# Patient Record
Sex: Female | Born: 1950 | Race: White | Hispanic: No | Marital: Married | Smoking: Never smoker
Health system: Southern US, Community
[De-identification: ages and names within clinical notes are randomized; demographics above are authoritative.]

## PROBLEM LIST (undated history)

## (undated) DIAGNOSIS — K219 Gastro-esophageal reflux disease without esophagitis: Secondary | ICD-10-CM

## (undated) DIAGNOSIS — J309 Allergic rhinitis, unspecified: Secondary | ICD-10-CM

## (undated) DIAGNOSIS — F419 Anxiety disorder, unspecified: Secondary | ICD-10-CM

## (undated) DIAGNOSIS — G4700 Insomnia, unspecified: Secondary | ICD-10-CM

## (undated) DIAGNOSIS — F329 Major depressive disorder, single episode, unspecified: Secondary | ICD-10-CM

## (undated) DIAGNOSIS — M8580 Other specified disorders of bone density and structure, unspecified site: Secondary | ICD-10-CM

## (undated) DIAGNOSIS — G35 Multiple sclerosis: Secondary | ICD-10-CM

## (undated) DIAGNOSIS — IMO0001 Reserved for inherently not codable concepts without codable children: Secondary | ICD-10-CM

## (undated) DIAGNOSIS — E785 Hyperlipidemia, unspecified: Secondary | ICD-10-CM

## (undated) DIAGNOSIS — A048 Other specified bacterial intestinal infections: Secondary | ICD-10-CM

## (undated) DIAGNOSIS — F988 Other specified behavioral and emotional disorders with onset usually occurring in childhood and adolescence: Secondary | ICD-10-CM

## (undated) DIAGNOSIS — F32A Depression, unspecified: Secondary | ICD-10-CM

## (undated) HISTORY — DX: Other specified bacterial intestinal infections: A04.8

## (undated) HISTORY — DX: Hyperlipidemia, unspecified: E78.5

## (undated) HISTORY — DX: Multiple sclerosis: G35

## (undated) HISTORY — PX: COLONOSCOPY: SHX174

## (undated) HISTORY — DX: Gastro-esophageal reflux disease without esophagitis: K21.9

## (undated) HISTORY — PX: ESOPHAGOGASTRODUODENOSCOPY: SHX1529

## (undated) HISTORY — PX: DILATION AND CURETTAGE OF UTERUS: SHX78

## (undated) HISTORY — DX: Reserved for inherently not codable concepts without codable children: IMO0001

## (undated) HISTORY — PX: OTHER SURGICAL HISTORY: SHX169

## (undated) HISTORY — DX: Other specified disorders of bone density and structure, unspecified site: M85.80

## (undated) HISTORY — DX: Insomnia, unspecified: G47.00

## (undated) HISTORY — DX: Major depressive disorder, single episode, unspecified: F32.9

## (undated) HISTORY — DX: Allergic rhinitis, unspecified: J30.9

## (undated) HISTORY — DX: Anxiety disorder, unspecified: F41.9

## (undated) HISTORY — DX: Other specified behavioral and emotional disorders with onset usually occurring in childhood and adolescence: F98.8

## (undated) HISTORY — DX: Depression, unspecified: F32.A

---

## 1998-11-24 ENCOUNTER — Ambulatory Visit (HOSPITAL_COMMUNITY): Admission: RE | Admit: 1998-11-24 | Discharge: 1998-11-24 | Payer: Self-pay | Admitting: *Deleted

## 1998-11-24 ENCOUNTER — Encounter: Payer: Self-pay | Admitting: *Deleted

## 2000-07-26 ENCOUNTER — Encounter: Payer: Self-pay | Admitting: Family Medicine

## 2000-07-26 ENCOUNTER — Ambulatory Visit (HOSPITAL_COMMUNITY): Admission: RE | Admit: 2000-07-26 | Discharge: 2000-07-26 | Payer: Self-pay | Admitting: Family Medicine

## 2000-08-29 ENCOUNTER — Other Ambulatory Visit: Admission: RE | Admit: 2000-08-29 | Discharge: 2000-08-29 | Payer: Self-pay | Admitting: *Deleted

## 2000-12-13 ENCOUNTER — Other Ambulatory Visit: Admission: RE | Admit: 2000-12-13 | Discharge: 2000-12-13 | Payer: Self-pay | Admitting: *Deleted

## 2002-04-04 HISTORY — PX: BREAST CYST ASPIRATION: SHX578

## 2002-06-11 ENCOUNTER — Encounter: Payer: Self-pay | Admitting: Obstetrics and Gynecology

## 2002-06-11 ENCOUNTER — Ambulatory Visit (HOSPITAL_COMMUNITY): Admission: RE | Admit: 2002-06-11 | Discharge: 2002-06-11 | Payer: Self-pay | Admitting: Obstetrics and Gynecology

## 2002-06-21 ENCOUNTER — Ambulatory Visit (HOSPITAL_COMMUNITY): Admission: RE | Admit: 2002-06-21 | Discharge: 2002-06-21 | Payer: Self-pay | Admitting: Internal Medicine

## 2002-06-24 ENCOUNTER — Other Ambulatory Visit: Admission: RE | Admit: 2002-06-24 | Discharge: 2002-06-24 | Payer: Self-pay | Admitting: Obstetrics and Gynecology

## 2003-08-11 ENCOUNTER — Ambulatory Visit (HOSPITAL_COMMUNITY): Admission: RE | Admit: 2003-08-11 | Discharge: 2003-08-11 | Payer: Self-pay | Admitting: Family Medicine

## 2003-12-23 ENCOUNTER — Ambulatory Visit (HOSPITAL_COMMUNITY): Admission: RE | Admit: 2003-12-23 | Discharge: 2003-12-23 | Payer: Self-pay | Admitting: Psychiatry

## 2004-09-01 ENCOUNTER — Ambulatory Visit (HOSPITAL_COMMUNITY): Admission: RE | Admit: 2004-09-01 | Discharge: 2004-09-01 | Payer: Self-pay | Admitting: Family Medicine

## 2005-03-21 ENCOUNTER — Other Ambulatory Visit: Payer: Self-pay

## 2005-03-23 ENCOUNTER — Ambulatory Visit (HOSPITAL_COMMUNITY): Admission: RE | Admit: 2005-03-23 | Discharge: 2005-03-23 | Payer: Self-pay | Admitting: Obstetrics and Gynecology

## 2005-04-08 ENCOUNTER — Ambulatory Visit: Payer: Self-pay | Admitting: Obstetrics and Gynecology

## 2005-11-09 ENCOUNTER — Ambulatory Visit (HOSPITAL_COMMUNITY): Admission: RE | Admit: 2005-11-09 | Discharge: 2005-11-09 | Payer: Self-pay | Admitting: Obstetrics and Gynecology

## 2006-03-17 ENCOUNTER — Ambulatory Visit: Payer: Self-pay | Admitting: Internal Medicine

## 2006-04-17 ENCOUNTER — Ambulatory Visit: Payer: Self-pay | Admitting: Internal Medicine

## 2006-06-02 ENCOUNTER — Ambulatory Visit (HOSPITAL_COMMUNITY): Admission: RE | Admit: 2006-06-02 | Discharge: 2006-06-02 | Payer: Self-pay | Admitting: Family Medicine

## 2006-11-16 ENCOUNTER — Ambulatory Visit (HOSPITAL_COMMUNITY): Admission: RE | Admit: 2006-11-16 | Discharge: 2006-11-16 | Payer: Self-pay | Admitting: Obstetrics and Gynecology

## 2006-12-21 ENCOUNTER — Ambulatory Visit (HOSPITAL_COMMUNITY): Admission: RE | Admit: 2006-12-21 | Discharge: 2006-12-21 | Payer: Self-pay | Admitting: Family Medicine

## 2008-02-11 ENCOUNTER — Ambulatory Visit: Payer: Self-pay | Admitting: Internal Medicine

## 2008-02-12 ENCOUNTER — Ambulatory Visit (HOSPITAL_COMMUNITY): Admission: RE | Admit: 2008-02-12 | Discharge: 2008-02-12 | Payer: Self-pay | Admitting: Family Medicine

## 2008-03-05 ENCOUNTER — Ambulatory Visit: Payer: Self-pay | Admitting: Internal Medicine

## 2008-03-05 ENCOUNTER — Ambulatory Visit (HOSPITAL_COMMUNITY): Admission: RE | Admit: 2008-03-05 | Discharge: 2008-03-05 | Payer: Self-pay | Admitting: Internal Medicine

## 2008-04-25 ENCOUNTER — Ambulatory Visit: Payer: Self-pay | Admitting: Internal Medicine

## 2008-05-23 ENCOUNTER — Ambulatory Visit (HOSPITAL_COMMUNITY): Admission: RE | Admit: 2008-05-23 | Discharge: 2008-05-23 | Payer: Self-pay | Admitting: Family Medicine

## 2008-11-25 ENCOUNTER — Encounter: Payer: Self-pay | Admitting: Urgent Care

## 2009-03-23 ENCOUNTER — Encounter: Payer: Self-pay | Admitting: Gastroenterology

## 2009-03-31 ENCOUNTER — Encounter: Payer: Self-pay | Admitting: Gastroenterology

## 2009-04-09 ENCOUNTER — Ambulatory Visit (HOSPITAL_COMMUNITY): Admission: RE | Admit: 2009-04-09 | Discharge: 2009-04-09 | Payer: Self-pay | Admitting: Family Medicine

## 2009-05-05 DIAGNOSIS — F411 Generalized anxiety disorder: Secondary | ICD-10-CM

## 2009-05-05 DIAGNOSIS — K228 Other specified diseases of esophagus: Secondary | ICD-10-CM

## 2009-05-05 DIAGNOSIS — F419 Anxiety disorder, unspecified: Secondary | ICD-10-CM | POA: Insufficient documentation

## 2009-05-05 DIAGNOSIS — K5909 Other constipation: Secondary | ICD-10-CM | POA: Insufficient documentation

## 2009-05-05 DIAGNOSIS — K219 Gastro-esophageal reflux disease without esophagitis: Secondary | ICD-10-CM

## 2009-05-05 DIAGNOSIS — Z8719 Personal history of other diseases of the digestive system: Secondary | ICD-10-CM | POA: Insufficient documentation

## 2009-05-05 DIAGNOSIS — K648 Other hemorrhoids: Secondary | ICD-10-CM | POA: Insufficient documentation

## 2009-05-05 DIAGNOSIS — G35 Multiple sclerosis: Secondary | ICD-10-CM | POA: Insufficient documentation

## 2009-05-05 DIAGNOSIS — F329 Major depressive disorder, single episode, unspecified: Secondary | ICD-10-CM

## 2009-05-05 DIAGNOSIS — F32A Depression, unspecified: Secondary | ICD-10-CM | POA: Insufficient documentation

## 2009-11-30 ENCOUNTER — Encounter: Payer: Self-pay | Admitting: Gastroenterology

## 2009-12-01 ENCOUNTER — Encounter: Payer: Self-pay | Admitting: Gastroenterology

## 2010-02-18 ENCOUNTER — Ambulatory Visit (HOSPITAL_COMMUNITY): Admission: RE | Admit: 2010-02-18 | Discharge: 2010-02-18 | Payer: Self-pay | Admitting: Family Medicine

## 2010-04-24 ENCOUNTER — Encounter: Payer: Self-pay | Admitting: Obstetrics and Gynecology

## 2010-04-25 ENCOUNTER — Encounter: Payer: Self-pay | Admitting: Obstetrics and Gynecology

## 2010-04-26 ENCOUNTER — Encounter: Payer: Self-pay | Admitting: Urgent Care

## 2010-04-30 ENCOUNTER — Encounter (INDEPENDENT_AMBULATORY_CARE_PROVIDER_SITE_OTHER): Payer: Self-pay | Admitting: *Deleted

## 2010-05-04 NOTE — Medication Information (Addendum)
Summary: ACIPHEX 20MG   ACIPHEX 20MG    Imported By: Rexene Alberts 12/01/2009 10:24:26  _____________________________________________________________________  External Attachment:    Type:   Image     Comment:   External Document  Appended Document: ACIPHEX 20MG  duplicate  Appended Document: ACIPHEX 20MG  Please schedule 1 yr FU OV   Prescriptions: ACIPHEX 20 MG TBEC (RABEPRAZOLE SODIUM) one by mouth daily for reflux  #30 x 0   Entered and Authorized by:   Joselyn Arrow FNP-BC   Signed by:   Joselyn Arrow FNP-BC on 04/26/2010   Method used:   Electronically to        [facility] (retail)       [address]       [place], [place]  [address]       Ph: [phone] or [phone]       Fax: [phone]   RxID:   [id number]     Appended Document: ACIPHEX 20MG  mailed letter for pt to call and set up OV

## 2010-05-04 NOTE — Medication Information (Signed)
Summary: ACIPHEX 20MG   ACIPHEX 20MG    Imported By: Rexene Alberts 11/30/2009 15:42:22  _____________________________________________________________________  External Attachment:    Type:   Image     Comment:   External Document  Appended Document: ACIPHEX 20MG     Prescriptions: ACIPHEX 20 MG TBEC (RABEPRAZOLE SODIUM) one by mouth daily for reflux  #30 x 5   Entered and Authorized by:   Leanna Battles. Dixon Boos   Signed by:   Leanna Battles Dixon Boos on 12/01/2009   Method used:   Electronically to        [facility] (retail)       [address]       [place], [place]  [address]       Ph: [phone] or [phone]       Fax: [phone]   RxID:   [id number]    NEEDS OV BY END OF YEAR FOR F/U GERD  Appended Document: ACIPHEX 20MG  LMOM FOR PT TO CALL TO SET UP OV TO FURTHER REFILLS

## 2010-05-06 NOTE — Medication Information (Signed)
Summary: ACIPHEX 20MG   ACIPHEX 20MG    Imported By: Rexene Alberts 04/26/2010 12:09:26  _____________________________________________________________________  External Attachment:    Type:   Image     Comment:   External Document  Appended Document: ACIPHEX 20MG  See today's append 12/01/09 note

## 2010-05-06 NOTE — Letter (Signed)
Summary: Recall Office Visit  [facility]  [address]   [place], [place] [address]   Phone: [phone]  Fax: [phone]      April 30, 2010   Jasmine Patton [address] [place], [place]  [address] 1950-08-06   Dear Ms. Kain,   According to our records, it is time for you to schedule a follow-up office visit with [place].   At your convenience, please call [phone] to schedule an office visit. If you have any questions, concerns, or feel that this letter is in error, we would appreciate your call.   Sincerely,    Diana Eves  [facility] Ph: [phone]   Fax: [phone]

## 2010-08-06 ENCOUNTER — Other Ambulatory Visit: Payer: Self-pay | Admitting: Family Medicine

## 2010-08-06 DIAGNOSIS — Z139 Encounter for screening, unspecified: Secondary | ICD-10-CM

## 2010-08-09 ENCOUNTER — Ambulatory Visit (HOSPITAL_COMMUNITY)
Admission: RE | Admit: 2010-08-09 | Discharge: 2010-08-09 | Disposition: A | Discharge status: Home / Self Care | Payer: Medicare Other | Source: Ambulatory Visit | Attending: Family Medicine | Admitting: Family Medicine

## 2010-08-09 DIAGNOSIS — Z139 Encounter for screening, unspecified: Secondary | ICD-10-CM

## 2010-08-09 DIAGNOSIS — Z1231 Encounter for screening mammogram for malignant neoplasm of breast: Secondary | ICD-10-CM | POA: Insufficient documentation

## 2010-08-17 NOTE — Op Note (Signed)
Jasmine Patton, Jasmine Patton              ACCOUNT NO.:  [id number]   MEDICAL RECORD NO.:  [id number]          PATIENT TYPE:  AMB   LOCATION:  DAY                           FACILITY:  [facility]   PHYSICIAN:  R. Roetta Sessions, M.D. DATE OF BIRTH:  1951-01-22   DATE OF PROCEDURE:  03/05/2008  DATE OF DISCHARGE:                               OPERATIVE REPORT   INDICATIONS FOR PROCEDURE:  A 60 year old lady with positive family  history of colon cancer first relative at young age, chronic  constipation, refractory gastroesophageal reflux disease symptoms  longstanding in spite of PPI therapy.  She never had her upper GI tract  evaluated.  EGD colonoscopy now being done.  Risks, benefits,  alternatives, and limitations have been reviewed and questions were  answered.  She is agreeable.  Please see the documentation in the  medical record.   PROCEDURE NOTE:  O2 saturation, blood pressure, pulse, and respirations  monitored throughout the entire procedure.  Conscious sedation, Versed 6  mg IV and Demerol 125 mg IV in divided doses.  Cetacaine spray for  topical pharyngeal anesthesia.   INSTRUMENT:  Pentax video chip system.   FINDINGS:  EGD:  Examination of the tubular esophagus revealed patulous  EG junction with multiple distal esophageal erosions.  The longest being  approximately 5 cm of the GE junction.  There is no Barrett esophagus or  other abnormality.  EG junction was easily traversed.  Stomach:  Gastric  cavity was emptied and insufflated well with air.  Through examination  of the gastric mucosa including retroflexion at the proximal stomach and  esophagogastric junction demonstrated only a small hiatal hernia.  Pylorus was patent and easily traversed.  Examination of the bulb,  second portion revealed no abnormalities.  Therapeutic/diagnostic  maneuvers performed:  None.  The patient tolerated the procedure well.  Colonoscopy:  Digital rectal exam revealed no abnormalities.  Endoscopic  findings:  The prep was good.  Colon:  Colonic mucosa was surveyed from  the rectosigmoid junction through the left, transverse, right colon,  through the appendiceal orifice, ileocecal valve, and cecum.  These  structures were well seen and photographed for the record.  From this  level, the scope was slowly and cautiously withdrawn.  All previously  mentioned mucosal surfaces were once again seen.  The colon was somewhat  elongated and tortuous; however, the mucosa appeared entirely normal.  The scope was pulled down to the rectum.  The rectal vault was small and  was unable to retroflex, but for the same reason, I was able to see the  rectal mucosa very well on fall.  She had minimal internal hemorrhoids  in the anal papilla only.  The patient tolerated both procedures well  and was reacted in Endoscopy.   IMPRESSION:  Esophagogastroduodenoscopy multiple distal esophageal  erosions, patulous esophagogastric junction.   Findings consistent with moderately severe erosive reflux esophagitis,  small hiatal hernia, otherwise normal stomach, D1 and D2.  Colonoscopy  findings, anal papilla and internal hemorrhoids, otherwise normal  rectum.  Somewhat tortuous elongated, but otherwise normal-appearing  colon.   RECOMMENDATIONS:  1.  Stop Prilosec, begin AcipHex 20 mg orally twice daily 30 minutes      before breakfast and supper for 1 month and back off to once daily      before breakfast thereafter.  Antireflux literature provided to Ms.      Patton.  2. She is somewhat constipated.  I have recommended she start on some      MiraLax 17 g orally at bedtime.  If she has not had a stool on any      given day, we will plan to see this nice lady back in the office in      6 weeks out and see how she is doing.  She have to return for a      high-risk screening colonoscopy in 5 years' time.      Jonathon Bellows, M.D.  Electronically Signed     RMR/MEDQ  D:  03/05/2008  T:  03/05/2008   Job:  [id number]   cc:   Jasmine Patton, M.D.  Fax: [phone]

## 2010-08-17 NOTE — H&P (Signed)
NAMECIEANNA, Jasmine Patton              ACCOUNT NO.:  [id number]   MEDICAL RECORD NO.:  [id number]          PATIENT TYPE:  AMB   LOCATION:  DAY                           FACILITY:  [facility]   PHYSICIAN:  R. Roetta Sessions, M.D. DATE OF BIRTH:  07/07/1950   DATE OF ADMISSION:  DATE OF DISCHARGE:  LH                              HISTORY & PHYSICAL   PRIMARY CARE PHYSICIAN:  Donna Bernard, MD.   CHIEF COMPLAINTS:  High-risk screening colonoscopy.   HISTORY OF PRESENT ILLNESS:  Jasmine Patton is a 60 year old Caucasian  female.  She has a family history of colon cancer diagnosed in her  mother in her 36s.  She is due for 5-year high-risk screening  colonoscopy.  She has had some chronic constipation, but generally has a  bowel movement every other day.  She rarely takes colon cleanser over  the counter.  She has some straining at times.  Denies any recent rectal  bleeding or melena.  Her weight is up approximately 20 pounds from her  baseline per her report.  She notes her appetite has been very good, but  she has noticed worsening of her heartburn, especially with certain  foods and wine.  She has been trying to avoid foods and is currently  trying to lose some weight.  She occasionally has to take Tums in  addition to her omeprazole 20 mg b.i.d.  She has about 6-7 episodes per  month with breakthrough symptoms.  She denies any dysphagia or  odynophagia.  She has never had a screening EGD for Barrett esophagus.   PAST MEDICAL AND SURGICAL HISTORY:  Chronic GERD, MS, anxiety,  depression, uterine ablation, left benign breast cyst removal, neurosis,  and history of H. Pylori, positive serology treated by Dr. Gerda Diss  several years ago.  Last colonoscopy in June 21, 2002, by Dr. Jena Gauss  anal papilla, otherwise normal.   CURRENT MEDICATIONS:  1. Xanax 0.5 mg p.r.n.  2. Doxepin hydrochloride 50-100 mg nightly.  3. Pristiq b.i.d.  4. Omeprazole 20 mg b.i.d.  5. Phentermine HCl 37.5 mg daily.  6. Cerefolin 5.6/2 600 mg daily.  7. Multivitamin daily.  8. Calcium 500 mg.  9. Vitamin D daily.   ALLERGIES:  No known drug allergies.   FAMILY HISTORY:  Positive for mother in her 65s diagnosed with  colorectal carcinoma.  She also had history of breast cancer and  dementia, died at age 4.  Father deceased at 71 secondary to coronary  artery disease.  She has 2 healthy siblings.   SOCIAL HISTORY:  Jasmine Patton is married.  She is disabled.  She denies  any tobacco or drug use.  She consumes about 5 glasses of wine per week.   REVIEW OF SYSTEMS:  Endocrine:  Given weight gain, she is having a TSH  checked.  She also had a Pap smear last week at Dr. Fletcher Anon office.  Otherwise, negative review of systems.   PHYSICAL EXAMINATION:  VITAL SIGNS:  Weight 174.5 pounds, height 60  inches, temperature 97.9, blood pressure 130/76, and pulse 88.  GENERAL:  She is an  obese Caucasian female who is alert, oriented,  pleasant, and cooperative, in no acute distress.  HEENT:  Sclerae clear and nonicteric.  Conjunctivae pink.  Oropharynx  pink and moist without any lesions.  CHEST:  Heart regular rate and rhythm.  Normal S1 and S2 without any  murmurs, clicks, rubs, or gallops.  LUNGS:  Clear to auscultation bilaterally.  ABDOMEN:  Positive bowel sounds x4.  No bruits auscultated.  Soft,  nontender, and nondistended without palpable mass or hepatosplenomegaly.  No rebound, tenderness, or guarding.  EXTREMITIES:  Without clubbing or edema.  SKIN:  Pink, warm, and dry without any rash or jaundice.   IMPRESSION:  Jasmine Patton is a 60 year old Caucasian female who is due  for high-risk screening colonoscopy.  She does have some chronic  constipation, but does not interfere with her activities of daily  living.  She rarely takes over-the-counter supplements for this.   She has chronic gastroesophageal reflux disease with occasional  breakthrough symptoms and has never had screening for Barrett  esophagus.   PLAN:  1. High-risk screening colonoscopy and screening EGD.  At the same      time, discussed procedure including risks and benefits including,      but not limited to bleeding, infection, perforation, or drug      reaction.  A planned consent will be obtained.  2. Continue omeprazole 20 mg b.i.d.      [name], N.P.      Jonathon Bellows, M.D.  Electronically Signed    KJ/MEDQ  D:  02/11/2008  T:  02/12/2008  Job:  [id number]   cc:   Donna Bernard, M.D.  Fax: [phone]   Alanson Puls, M.D.

## 2010-08-17 NOTE — Assessment & Plan Note (Signed)
Jasmine Patton, Jasmine Patton               CHART#:  [id number]   DATE:  04/25/2008                       DOB:  December 27, 1950   CHIEF COMPLAINT:  Follow up GERD, erosive reflux esophagitis, and  constipation.   PROBLEM LIST:  1. Family history of colon cancer.  2. Chronic constipation.  EGD by Dr. Jena Gauss on March 05, 2008,      multiple distal esophageal erosions, patulous EG junction.  3. GERD and erosive reflux esophagitis with last colonoscopy March 05, 2008, by Dr. Jena Gauss, anal papilla, internal hemorrhoids.  4. MS  5. Anxiety and depression.  6. Status post uterine ablation.  7. History of H. pylori, positive serologies by Dr. Gerda Diss several      years ago.   SUBJECTIVE:  The patient is a 60 year old Caucasian female.  She was  found to have erosive reflux esophagitis.  She has been on Aciphex 20 mg  b.i.d. and doing very well.  She has just recently started once daily  Aciphex.  She denies any breakthrough symptoms at this time, but again  she has just started.  She continues to have chronic constipation, but  she has not been taking her fiber and she has not followed with  recommendation of MiraLax yet either.  She can go several days without a  bowel movement.  She feels as though this has been a chronic problem  because of her MS.  Her appetite is good.  She is wanting to lose some  weight this year.  Her weight is down 5 pounds and this has been  intentional.   CURRENT MEDICATIONS:  See the list from April 25, 2008.   ALLERGIES:  No known drug allergies.   OBJECTIVE:  VITAL SIGNS:  Weight 169 pounds, height 60 inches,  temperature 97.6, blood pressure 110/78, and pulse 68.  GENERAL:  She is a well-developed and well-nourished Caucasian female in  no acute distress.  HEENT:  Sclerae clear, nonicteric.  Conjunctivae pink.  Oropharynx pink  and moist without any lesions.  CHEST:  Heart, regular rate and rhythm.  Normal S1 and S2.  ABDOMEN:  Positive bowel sounds x4.   No bruits auscultated.  Soft,  nontender, nondistended without palpable mass or hepatosplenomegaly.  No  rebound, tenderness, or guarding.  EXTREMITIES:  Without clubbing or edema.   ASSESSMENT:  Gastroesophageal reflux disease/erosive reflux esophagitis  well controlled on proton pump inhibitor.  She has just started daily  Aciphex.  She will call if she has any breakthrough symptoms.   PLAN:  1. MiraLax 17 g daily as needed for constipation.  2. Add Benefiber to her daily routine.  3. She will increase her dietary fiber.  4. Suggested aqua aerobics classes and 6 small meals per day for GERD      and diet control.  5. Office visit with Dr. Jena Gauss in 1 year.       [name], N.P.  Electronically Signed     R. Roetta Sessions, M.D.  Electronically Signed    KJ/MEDQ  D:  04/25/2008  T:  04/26/2008  Job:  [id number]   cc:   Donna Bernard, M.D.

## 2010-08-20 NOTE — Op Note (Signed)
   NAME:  Jasmine Patton, Jasmine Patton                        ACCOUNT NO.:  [id number]   MEDICAL RECORD NO.:  [id number]                   PATIENT TYPE:  AMB   LOCATION:  DAY                                  FACILITY:  [facility]   PHYSICIAN:  R. Roetta Sessions, M.D.              DATE OF BIRTH:  06-15-1950   DATE OF PROCEDURE:  06/21/2002  DATE OF DISCHARGE:                                 OPERATIVE REPORT   PROCEDURE:  High-risk screening colonoscopy.   INDICATIONS FOR PROCEDURE:  The patient is a 60 year old lady devoid of any  lower GI tract symptoms whose mother was diagnosed with colorectal cancer in  her late 76s.  She has never had her lower GI tract evaluated.  She is now  coming in for high-risk screening colonoscopy.  This approach has been  discussed with the patient previously.  The potential risks, benefits, and  alternatives have been reviewed and questions answered.  She is agreeable.  Please see my handwritten H&P for more information.   PROCEDURE:  O2 saturation, blood pressure, pulses, and respirations were  monitored throughout the entire procedure.  Conscious sedation was with  Versed 5 mg IV, Demerol 125 mg IV in divided doses.  The instrument used was  the Olympus video chip adult colonoscope.   FINDINGS:  Digital rectal exam revealed a couple of pea-size lesions just  outside the anal verge.   ENDOSCOPIC FINDINGS:  The prep was excellent.   Rectum:  Examination of the rectal mucosa including a retroflex view of the  anal verge revealed four anal papillae, otherwise normal rectum.   Colon:  The colonic mucosa was surveyed from the rectosigmoid junction  through the left, transverse, right colon to the area of the appendiceal  orifice, ileocecal valve, and cecum.  These structures were well-seen and  photographed for the record.  The colonic mucosa appeared entirely normal  all the way to the cecum from the level of the cecum to the ileocecal valve.  The scope was slowly  withdrawn.  All previously mentioned mucosal surfaces  were again seen, and again, no other abnormalities were observed.  The  patient tolerated the procedure well and was reactive in endoscopy.    IMPRESSION:  Anal papillae.  Otherwise normal rectum, normal colon.   RECOMMENDATIONS:  Repeat colonoscopy in five years.                                               Jonathon Bellows, M.D.    RMR/MEDQ  D:  06/21/2002  T:  06/21/2002  Job:  [id number]   cc:   Lorin Picket A. Gerda Diss, M.D.  [address]  [place]  [place] [address]  Fax: [phone]

## 2011-10-25 ENCOUNTER — Other Ambulatory Visit: Payer: Self-pay | Admitting: Family Medicine

## 2011-10-25 DIAGNOSIS — M8580 Other specified disorders of bone density and structure, unspecified site: Secondary | ICD-10-CM

## 2011-11-21 ENCOUNTER — Other Ambulatory Visit (HOSPITAL_COMMUNITY): Payer: Medicare Other

## 2012-09-14 ENCOUNTER — Telehealth: Payer: Self-pay | Admitting: Family Medicine

## 2012-09-14 ENCOUNTER — Other Ambulatory Visit (HOSPITAL_COMMUNITY): Payer: Self-pay | Admitting: Family Medicine

## 2012-09-14 MED ORDER — METAXALONE 800 MG PO TABS: 800.0000 mg | ORAL_TABLET | Freq: Three times a day (TID) | ORAL | Status: DC

## 2012-09-14 NOTE — Telephone Encounter (Signed)
Pulled her back muscles working the yard.  Would like to know if a prescription for Skelaxin?  [facility].  States she is unable to drive to come in for an office visit.  Thanks

## 2012-09-14 NOTE — Telephone Encounter (Signed)
Med sent electronically to [facility]. Patient notified. 

## 2012-09-14 NOTE — Telephone Encounter (Signed)
skel 800 #30 one tid prn one ref

## 2012-09-17 ENCOUNTER — Encounter: Payer: Self-pay | Admitting: *Deleted

## 2012-10-18 ENCOUNTER — Other Ambulatory Visit (HOSPITAL_COMMUNITY): Payer: Self-pay | Admitting: Family Medicine

## 2012-10-27 ENCOUNTER — Other Ambulatory Visit: Payer: Self-pay

## 2012-10-27 MED ORDER — ALPRAZOLAM 0.5 MG PO TABS: 0.5000 mg | ORAL_TABLET | Freq: Two times a day (BID) | ORAL | Status: DC

## 2012-11-16 ENCOUNTER — Other Ambulatory Visit (HOSPITAL_COMMUNITY): Payer: Self-pay | Admitting: Family Medicine

## 2012-12-10 ENCOUNTER — Ambulatory Visit: Payer: Self-pay | Admitting: Family Medicine

## 2012-12-13 ENCOUNTER — Ambulatory Visit (INDEPENDENT_AMBULATORY_CARE_PROVIDER_SITE_OTHER): Payer: 59 | Admitting: Family Medicine

## 2012-12-13 ENCOUNTER — Encounter: Payer: Self-pay | Admitting: Family Medicine

## 2012-12-13 VITALS — BP 148/94 | Ht 61.0 in | Wt 172.8 lb

## 2012-12-13 DIAGNOSIS — F411 Generalized anxiety disorder: Secondary | ICD-10-CM

## 2012-12-13 DIAGNOSIS — K219 Gastro-esophageal reflux disease without esophagitis: Secondary | ICD-10-CM

## 2012-12-13 DIAGNOSIS — G35 Multiple sclerosis: Secondary | ICD-10-CM

## 2012-12-13 DIAGNOSIS — F329 Major depressive disorder, single episode, unspecified: Secondary | ICD-10-CM

## 2012-12-13 MED ORDER — FLUOXETINE HCL 20 MG PO CAPS: 20.0000 mg | ORAL_CAPSULE | Freq: Every day | ORAL | Status: DC

## 2012-12-13 MED ORDER — OMEPRAZOLE 20 MG PO CPDR: DELAYED_RELEASE_CAPSULE | ORAL | Status: DC

## 2012-12-13 MED ORDER — DOXEPIN HCL 50 MG PO CAPS: ORAL_CAPSULE | ORAL | Status: DC

## 2012-12-13 NOTE — Progress Notes (Signed)
  Subjective:    Patient ID: Jasmine Patton, female    DOB: December 20, 1950, 62 y.o.   MRN: [id number]  HPI Here today to discuss medications.  The Adderall is keeping her up all night. Causes trouble sleeping. Patient states she is getting help from the Adderall but the insomnia is a problem.  Has not seen her multiple sclerosis specialist lately  Overall the reflux is stable. Trying to diminish her spicy foods.    The Celexa is not working well; energy level is still low and she is not interested in much. Under quite a bit of stress. Son is getting married soon. Prozac has done well in the past. She wonders if she should take this.      Review of Systems    no chest pain no back pain no abdominal pain no change in bowel habits. No rash ROS otherwise negative. Objective:   Physical Exam Alert no acute distress. Lungs clear. Heart regular in rhythm. HEENT normal. Neuro exam intact.       Assessment & Plan:  Impression 1 ADHD suboptimum secondary to side effects. #2 depression somewhat worse discuss. #3 multiple sclerosis. #4 insomnia. #5 chronic anxiety states she definitely needs the Xanax that she takes. Plan change Adderall to 10 mg plain one each morning. Diet exercise discussed in encourage. Stop Celexa and start Prozac 20 every morning rationale discussed recheck in 4 months. WSL

## 2013-03-05 ENCOUNTER — Encounter: Payer: Self-pay | Admitting: Internal Medicine

## 2013-03-05 ENCOUNTER — Other Ambulatory Visit: Payer: Self-pay | Admitting: Family Medicine

## 2013-03-22 ENCOUNTER — Telehealth: Payer: Self-pay | Admitting: Family Medicine

## 2013-03-22 MED ORDER — FLUOXETINE HCL 20 MG PO TABS: 30.0000 mg | ORAL_TABLET | Freq: Every day | ORAL | Status: DC

## 2013-03-22 NOTE — Telephone Encounter (Signed)
Pt currently on Prozac 20mg , feels like it is helping but not enough and she feels like it needs to be increased, has taken before at a higher dose.  Has mistakenly taken twice in a day and felt better. Can we call in a stronger dose?  Please call in to [facility]

## 2013-03-22 NOTE — Telephone Encounter (Signed)
Medication sent in to pharmacy. Patient was notified.  

## 2013-03-22 NOTE — Telephone Encounter (Signed)
30 mg 6 ref

## 2013-04-15 ENCOUNTER — Ambulatory Visit (INDEPENDENT_AMBULATORY_CARE_PROVIDER_SITE_OTHER): Payer: 59 | Admitting: Family Medicine

## 2013-04-15 ENCOUNTER — Encounter: Payer: Self-pay | Admitting: Family Medicine

## 2013-04-15 VITALS — BP 130/82 | Ht 61.0 in | Wt 168.0 lb

## 2013-04-15 DIAGNOSIS — F411 Generalized anxiety disorder: Secondary | ICD-10-CM

## 2013-04-15 DIAGNOSIS — G35D Multiple sclerosis, unspecified: Secondary | ICD-10-CM

## 2013-04-15 DIAGNOSIS — G35 Multiple sclerosis: Secondary | ICD-10-CM

## 2013-04-15 DIAGNOSIS — K219 Gastro-esophageal reflux disease without esophagitis: Secondary | ICD-10-CM

## 2013-04-15 DIAGNOSIS — F329 Major depressive disorder, single episode, unspecified: Secondary | ICD-10-CM

## 2013-04-15 DIAGNOSIS — F3289 Other specified depressive episodes: Secondary | ICD-10-CM

## 2013-04-15 MED ORDER — ALPRAZOLAM 0.5 MG PO TABS: 0.5000 mg | ORAL_TABLET | Freq: Two times a day (BID) | ORAL | Status: DC

## 2013-04-15 MED ORDER — AMPHETAMINE-DEXTROAMPHETAMINE 10 MG PO TABS: 10.0000 mg | ORAL_TABLET | ORAL | Status: DC | PRN

## 2013-04-15 MED ORDER — METAXALONE 800 MG PO TABS: 800.0000 mg | ORAL_TABLET | Freq: Three times a day (TID) | ORAL | Status: DC

## 2013-04-15 MED ORDER — DESVENLAFAXINE SUCCINATE ER 50 MG PO TB24: 50.0000 mg | ORAL_TABLET | Freq: Every day | ORAL | Status: DC

## 2013-04-15 NOTE — Patient Instructions (Signed)
Cut prozac dose in half for one day, then the next day stop prozac and start the pritiq. If after several wks not feeling significantly better, call us and we will increase the pristiq to 100 mg daily

## 2013-04-15 NOTE — Progress Notes (Signed)
   Subjective:    Patient ID: Jasmine Patton, female    DOB: [birth year], 63 y.o.   MRN: [id number]  HPI Patient arrives for a follow up on depression. Patient increased Prozac to 40 mg a month ago but has seen no improvement in symptoms. Patient states she is dealing with significant depression currently.  Feels balance not as good, not exercising as regularly. Not seen ms doc for some time   reflux stable, need the med. Worse with certain tyoes of wine  [place] still at home, constantly stresses mo, moved in temporarily three and a half yrs ago, now home permanently???  Sleep at night so so when taking the xanax, takes over an hour to fall asleep  ADHD generally takes a fraction of a tablet each day,  zoloft didn't help after a few yrs, took serzone for awhile,pristiq helped a lot, did well for awhile,  Review of Systems    no chest pain no back pain no headache no change about habits no blood in stool no weight loss weight gain ROS otherwise negative Objective:   Physical Exam Alert no acute distress. HEENT normal. Lungs clear. Heart regular in rhythm. Abdomen benign. Low back some pain and tenderness in his to percussion. Ankles trace edema. Strength appears intact.       Assessment & Plan:  Impression 1 ADHD good control discussed. #2 insomnia good control discussed. #3 multiple sclerosis has not seen specialists for several years. Strongly encouraged to get back with specialist. #4 depression suboptimum discuss plan change to proceed. Wean off current serotonin daughter. Diet exercise discussed. Medicines refilled. Recheck as scheduled. WSL

## 2013-05-16 ENCOUNTER — Other Ambulatory Visit: Payer: Self-pay | Admitting: Family Medicine

## 2013-06-15 ENCOUNTER — Other Ambulatory Visit: Payer: Self-pay | Admitting: Family Medicine

## 2013-09-27 ENCOUNTER — Other Ambulatory Visit: Payer: Self-pay | Admitting: Family Medicine

## 2013-09-27 NOTE — Telephone Encounter (Signed)
Ok one mo each rec ov

## 2013-10-09 ENCOUNTER — Other Ambulatory Visit: Payer: Self-pay | Admitting: Family Medicine

## 2013-11-06 ENCOUNTER — Other Ambulatory Visit: Payer: Self-pay | Admitting: Family Medicine

## 2013-11-06 NOTE — Telephone Encounter (Signed)
Last seen 04/15/13

## 2013-11-06 NOTE — Telephone Encounter (Signed)
Ok one mo ov soon

## 2013-12-08 ENCOUNTER — Other Ambulatory Visit: Payer: Self-pay | Admitting: Family Medicine

## 2013-12-10 NOTE — Telephone Encounter (Signed)
f u appt due also

## 2013-12-10 NOTE — Telephone Encounter (Signed)
Ok plus one mo ref

## 2014-01-06 ENCOUNTER — Other Ambulatory Visit: Payer: Self-pay | Admitting: Family Medicine

## 2014-01-06 NOTE — Telephone Encounter (Signed)
Ok one mo only needs ov

## 2014-01-08 NOTE — Telephone Encounter (Signed)
6 mo ok 

## 2014-02-07 ENCOUNTER — Other Ambulatory Visit: Payer: Self-pay | Admitting: Nurse Practitioner

## 2014-02-07 ENCOUNTER — Encounter: Payer: Self-pay | Admitting: Family Medicine

## 2014-02-07 ENCOUNTER — Ambulatory Visit (INDEPENDENT_AMBULATORY_CARE_PROVIDER_SITE_OTHER): Payer: 59 | Admitting: Family Medicine

## 2014-02-07 VITALS — BP 130/70 | Ht 61.0 in | Wt 169.1 lb

## 2014-02-07 DIAGNOSIS — E785 Hyperlipidemia, unspecified: Secondary | ICD-10-CM

## 2014-02-07 DIAGNOSIS — F329 Major depressive disorder, single episode, unspecified: Secondary | ICD-10-CM

## 2014-02-07 DIAGNOSIS — Z23 Encounter for immunization: Secondary | ICD-10-CM

## 2014-02-07 DIAGNOSIS — E039 Hypothyroidism, unspecified: Secondary | ICD-10-CM

## 2014-02-07 DIAGNOSIS — K219 Gastro-esophageal reflux disease without esophagitis: Secondary | ICD-10-CM

## 2014-02-07 DIAGNOSIS — G35 Multiple sclerosis: Secondary | ICD-10-CM

## 2014-02-07 DIAGNOSIS — R5382 Chronic fatigue, unspecified: Secondary | ICD-10-CM

## 2014-02-07 DIAGNOSIS — G4700 Insomnia, unspecified: Secondary | ICD-10-CM

## 2014-02-07 DIAGNOSIS — Z79899 Other long term (current) drug therapy: Secondary | ICD-10-CM

## 2014-02-07 DIAGNOSIS — I10 Essential (primary) hypertension: Secondary | ICD-10-CM

## 2014-02-07 DIAGNOSIS — F32A Depression, unspecified: Secondary | ICD-10-CM

## 2014-02-07 DIAGNOSIS — R5383 Other fatigue: Secondary | ICD-10-CM

## 2014-02-07 MED ORDER — DESVENLAFAXINE SUCCINATE ER 100 MG PO TB24: 100.0000 mg | ORAL_TABLET | Freq: Every day | ORAL | Status: DC

## 2014-02-07 MED ORDER — METAXALONE 800 MG PO TABS: 800.0000 mg | ORAL_TABLET | Freq: Three times a day (TID) | ORAL | Status: AC

## 2014-02-07 MED ORDER — SUVOREXANT 10 MG PO TABS: 10.0000 mg | ORAL_TABLET | Freq: Every day | ORAL | Status: DC

## 2014-02-07 MED ORDER — ALPRAZOLAM 0.5 MG PO TABS: ORAL_TABLET | ORAL | Status: DC

## 2014-02-07 NOTE — Progress Notes (Signed)
   Subjective:    Patient ID: Jasmine Patton, female    DOB: May 21, 1950, 63 y.o.   MRN: [id number]  HPI Patient is here today for a follow up visit on depression/anxiety. Patient is doing well. Patient states that she is still depressed and fatigued a lot of times. She wants to discuss her current medications.   Patient needs a refill on her Progesterone cream to [facility]. Reports this helps with vaginal dryness.  Ongoing challenges with MS. Followed by specialist for this.  Continue challenges with reflux. Generally medication helped symptoms considerably.  Patient puzzled by her fatigue. Has always had a bit appears to be worse now. On further history not exercising as much as she once did.  Feeling down and depressed. A lot of family stress. Claims compliance with Pristiq. Feels she may need higher dose this time. No obvious side effects with current Pristiq   Patient has a list of difficulties i.e. Addison's diseaseCushing'sdisease etc.that her acupuncturist have asked her to talk with me about. We went through this rather lengthy list. With really only her thyroid worthy of assessment with current symptomatology discussed at great length   Review of Systems No headache no chest pain no back pain some diffuse achiness in joints and muscles. No major change in urinary or bowel habits ROS otherwise negative.    Objective:   Physical Exam  Alert mild malaise. HEENT normal. Lungs clear. Heart regular rate and rhythm. No focal joint inflammation noted. Ankles without edema. No rash. Neuro exam grossly intact.      Assessment & Plan:  Impression 1 fatigue likely multifactorial probably secondary to #2 #3 #4 #2 multiple sclerosis followed by specialist #3 depression somewhat worse #4 physical inactivity discussed reflux clinically stable #6 vaginal dryness good control with progesterone plan appropriate blood work. Exercise strongly encouraged. Increase prostate rationale  discussed 40 minutes spent most in discussion. WSL

## 2014-02-09 DIAGNOSIS — G4700 Insomnia, unspecified: Secondary | ICD-10-CM | POA: Insufficient documentation

## 2014-02-09 DIAGNOSIS — R5383 Other fatigue: Secondary | ICD-10-CM | POA: Insufficient documentation

## 2014-02-10 ENCOUNTER — Other Ambulatory Visit: Payer: Self-pay | Admitting: Family Medicine

## 2014-03-19 LAB — LIPID PANEL
Cholesterol: 237 mg/dL — ABNORMAL HIGH (ref 0–200)
HDL: 51 mg/dL (ref >39)
LDL CALC: 156 mg/dL — AB (ref 0–99)
Total CHOL/HDL Ratio: 4.6 Ratio
Triglycerides: 148 mg/dL (ref <150)
VLDL: 30 mg/dL (ref 0–40)

## 2014-03-19 LAB — BASIC METABOLIC PANEL
BUN: 14 mg/dL (ref 6–23)
CHLORIDE: 100 meq/L (ref 96–112)
CO2: 25 mEq/L (ref 19–32)
Calcium: 9.2 mg/dL (ref 8.4–10.5)
Creat: 0.78 mg/dL (ref 0.50–1.10)
Glucose, Bld: 91 mg/dL (ref 70–99)
Potassium: 4.4 mEq/L (ref 3.5–5.3)
SODIUM: 138 meq/L (ref 135–145)

## 2014-03-19 LAB — CBC WITH DIFFERENTIAL/PLATELET
BASOS PCT: 1 % (ref 0–1)
Basophils Absolute: 0.1 10*3/uL (ref 0.0–0.1)
Eosinophils Absolute: 0.2 10*3/uL (ref 0.0–0.7)
Eosinophils Relative: 3 % (ref 0–5)
HCT: 39.7 % (ref 36.0–46.0)
Hemoglobin: 13.4 g/dL (ref 12.0–15.0)
LYMPHS ABS: 2 10*3/uL (ref 0.7–4.0)
Lymphocytes Relative: 34 % (ref 12–46)
MCH: 31.2 pg (ref 26.0–34.0)
MCHC: 33.8 g/dL (ref 30.0–36.0)
MCV: 92.3 fL (ref 78.0–100.0)
MONO ABS: 0.4 10*3/uL (ref 0.1–1.0)
MONOS PCT: 7 % (ref 3–12)
NEUTROS ABS: 3.3 10*3/uL (ref 1.7–7.7)
Neutrophils Relative %: 55 % (ref 43–77)
Platelets: 362 10*3/uL (ref 150–400)
RBC: 4.3 MIL/uL (ref 3.87–5.11)
RDW: 13.2 % (ref 11.5–15.5)
WBC: 6 10*3/uL (ref 4.0–10.5)

## 2014-03-19 LAB — HEPATIC FUNCTION PANEL
ALT: 24 U/L (ref 0–35)
AST: 23 U/L (ref 0–37)
Albumin: 4.5 g/dL (ref 3.5–5.2)
Alkaline Phosphatase: 53 U/L (ref 39–117)
BILIRUBIN INDIRECT: 0.4 mg/dL (ref 0.2–1.2)
Bilirubin, Direct: 0.1 mg/dL (ref 0.0–0.3)
TOTAL PROTEIN: 6.9 g/dL (ref 6.0–8.3)
Total Bilirubin: 0.5 mg/dL (ref 0.2–1.2)

## 2014-03-19 LAB — T4: T4 TOTAL: 5.2 ug/dL (ref 4.5–12.0)

## 2014-03-19 LAB — TSH: TSH: 0.645 u[IU]/mL (ref 0.350–4.500)

## 2014-03-25 ENCOUNTER — Other Ambulatory Visit: Payer: Self-pay | Admitting: *Deleted

## 2014-03-25 ENCOUNTER — Telehealth: Payer: Self-pay | Admitting: *Deleted

## 2014-03-25 MED ORDER — DOXEPIN HCL 50 MG PO CAPS: 50.0000 mg | ORAL_CAPSULE | Freq: Every day | ORAL | Status: DC

## 2014-03-25 NOTE — Telephone Encounter (Signed)
Pt was switched from doxepin 50mg  qhs to belsomra 10mg  for insomnia. Pt states belsomra did not work and made her depression worse. Pt stopped med after 5 or 6 days of taking and would like to go back to doxepin. If you approve she would like it sen to [facility] and she does not need a call back if you approve.

## 2014-03-25 NOTE — Telephone Encounter (Signed)
Ok to change back to doxepin per Dr. Richardson Landry. Med sent to pharm.

## 2014-04-08 ENCOUNTER — Other Ambulatory Visit: Payer: Self-pay | Admitting: Family Medicine

## 2014-04-09 NOTE — Telephone Encounter (Signed)
Ok for 6 mo total

## 2014-04-09 NOTE — Telephone Encounter (Signed)
Seen 02/07/14

## 2014-05-07 ENCOUNTER — Encounter: Payer: Self-pay | Admitting: Family Medicine

## 2014-05-07 ENCOUNTER — Ambulatory Visit (INDEPENDENT_AMBULATORY_CARE_PROVIDER_SITE_OTHER): Payer: Medicare Other | Admitting: Family Medicine

## 2014-05-07 VITALS — BP 120/82 | Ht 61.0 in | Wt 166.0 lb

## 2014-05-07 DIAGNOSIS — R300 Dysuria: Secondary | ICD-10-CM

## 2014-05-07 DIAGNOSIS — N3000 Acute cystitis without hematuria: Secondary | ICD-10-CM

## 2014-05-07 LAB — POCT URINALYSIS DIPSTICK
Spec Grav, UA: 1.02
pH, UA: 6

## 2014-05-07 MED ORDER — CIPROFLOXACIN HCL 250 MG PO TABS: 250.0000 mg | ORAL_TABLET | Freq: Two times a day (BID) | ORAL | Status: DC

## 2014-05-07 NOTE — Progress Notes (Signed)
   Subjective:    Patient ID: Jasmine Patton, female    DOB: 03/24/1951, 64 y.o.   MRN: [id number]  Dysuria  This is a new problem. The current episode started in the past 7 days. Associated symptoms include frequency and urgency.   No utis since five yrs ago  No fever or chills or achey  No fever  Did not take azo,   Patient has also been researching and wonders my opinion on chronic Lyme disease as it pertains to her fatigue from long-standing for many years. Of note confirmed history of MS and depression      Review of Systems  Genitourinary: Positive for dysuria, urgency and frequency.   no fever no vomiting no chills     Objective:   Physical Exam  Alert no acute distress vital stable HEENT normal no CVA tenderness.  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 I advised the patient the chance of this is a extremely low. Much higher chance of false positive which would lead to unnecessary and unhelpful treatment. Plan Cipro 250 twice a day. 5 days. Warning signs discussed 15 minutes most in discussion

## 2014-09-22 ENCOUNTER — Other Ambulatory Visit: Payer: Self-pay | Admitting: Family Medicine

## 2014-09-23 NOTE — Telephone Encounter (Signed)
30 d only needs chronic visit

## 2014-09-23 NOTE — Telephone Encounter (Signed)
Needs office visit.

## 2014-10-08 ENCOUNTER — Ambulatory Visit (INDEPENDENT_AMBULATORY_CARE_PROVIDER_SITE_OTHER): Payer: Medicare Other | Admitting: Family Medicine

## 2014-10-08 ENCOUNTER — Encounter: Payer: Self-pay | Admitting: Family Medicine

## 2014-10-08 VITALS — BP 102/80 | Ht 61.0 in | Wt 170.4 lb

## 2014-10-08 DIAGNOSIS — I10 Essential (primary) hypertension: Secondary | ICD-10-CM

## 2014-10-08 DIAGNOSIS — R5382 Chronic fatigue, unspecified: Secondary | ICD-10-CM | POA: Diagnosis not present

## 2014-10-08 DIAGNOSIS — G4700 Insomnia, unspecified: Secondary | ICD-10-CM

## 2014-10-08 DIAGNOSIS — E039 Hypothyroidism, unspecified: Secondary | ICD-10-CM | POA: Diagnosis not present

## 2014-10-08 DIAGNOSIS — F32A Depression, unspecified: Secondary | ICD-10-CM

## 2014-10-08 DIAGNOSIS — F329 Major depressive disorder, single episode, unspecified: Secondary | ICD-10-CM | POA: Diagnosis not present

## 2014-10-08 DIAGNOSIS — G35 Multiple sclerosis: Secondary | ICD-10-CM

## 2014-10-08 MED ORDER — DOXEPIN HCL 50 MG PO CAPS: 50.0000 mg | ORAL_CAPSULE | Freq: Every day | ORAL | Status: DC

## 2014-10-08 MED ORDER — AMPHETAMINE-DEXTROAMPHETAMINE 10 MG PO TABS: 10.0000 mg | ORAL_TABLET | ORAL | Status: DC | PRN

## 2014-10-08 MED ORDER — CLONAZEPAM 0.5 MG PO TABS: 0.5000 mg | ORAL_TABLET | Freq: Every evening | ORAL | Status: DC | PRN

## 2014-10-08 MED ORDER — DESVENLAFAXINE SUCCINATE ER 100 MG PO TB24: ORAL_TABLET | ORAL | Status: DC

## 2014-10-08 NOTE — Progress Notes (Signed)
   Subjective:    Patient ID: Jasmine Patton, female    DOB: Feb 21, 1951, 64 y.o.   MRN: [id number]  HPI Patient is here today for a follow up visit on depression/med check.   Exercising not the best , dim energy  Balance on the poor side  Considering tr mill or swimmin g pool   Patient states that she has no concerns at this time. Patient is doing very well.   appt with m s spec in September, may consdier tabs if necessary  Still having burning sens in the legs  symp in the summer tend to do better  Tried lunesta and did not help, trazadone didn't help  More lethargic in the morn,  Uses the doxepin and the xanax qhs.  Has not always hel d a huge difference with off doxepin  Eats well and healthier  Reflux and heartburn stable while on the omep, needs it.   Last m s spec voisit 2006   Now on the pristiz regular,  But uses the adderall prn some trouble sleeping  Needs ref  Still feels fogggy in the morn     Review of Systems No headache no chest pain positive fatigue positive shortness of breath no change in bowel habits no blood in stool    Objective:   Physical Exam  Alert vitals stable blood pressure good on repeat HEENT normal. Lungs clear. Heart regular in rhythm. Ankles without edema. No focal neurological deficits.      Assessment & Plan:  Impression 1 insomnia discussed length #2 multiple sclerosis ongoing discussed length #3 ADHD ongoing with substantial challenges #4 fatigue. Plan diet exercise discussed. Medications adjusted. Meds refilled. Follow-up in 6 months. WSL

## 2014-10-21 ENCOUNTER — Other Ambulatory Visit: Payer: Self-pay | Admitting: Family Medicine

## 2014-10-28 ENCOUNTER — Telehealth: Payer: Self-pay | Admitting: Family Medicine

## 2014-10-28 MED ORDER — PREDNISONE 20 MG PO TABS: ORAL_TABLET | ORAL | Status: DC

## 2014-10-28 NOTE — Telephone Encounter (Signed)
Prednisone taper per Dr Bary Leriche verbal order. Med sent in electronically to pharmacy. Patient notified.

## 2014-10-28 NOTE — Telephone Encounter (Signed)
Pt is calling due to chiggers and feeling she can't control them herself Can we call her in something

## 2014-12-07 ENCOUNTER — Other Ambulatory Visit: Payer: Self-pay | Admitting: Family Medicine

## 2015-03-21 ENCOUNTER — Other Ambulatory Visit: Payer: Self-pay | Admitting: Family Medicine

## 2015-03-23 ENCOUNTER — Telehealth: Payer: Self-pay | Admitting: Family Medicine

## 2015-03-23 MED ORDER — AMPHETAMINE-DEXTROAMPHETAMINE 10 MG PO TABS: 10.0000 mg | ORAL_TABLET | ORAL | Status: DC | PRN

## 2015-03-23 NOTE — Telephone Encounter (Signed)
Pt is needing a refill on her adderall.  

## 2015-03-23 NOTE — Telephone Encounter (Signed)
Last seen 10/2014

## 2015-03-23 NOTE — Telephone Encounter (Signed)
Await md sign

## 2015-03-23 NOTE — Telephone Encounter (Signed)
Last seen 7.6.16.

## 2015-03-23 NOTE — Telephone Encounter (Signed)
One mo both needs o v

## 2015-03-23 NOTE — Telephone Encounter (Signed)
Rx up front for patient pick up. Patient notified. 

## 2015-03-23 NOTE — Telephone Encounter (Signed)
Ok one mo o v needed early jan

## 2015-03-24 ENCOUNTER — Other Ambulatory Visit: Payer: Self-pay | Admitting: Family Medicine

## 2015-04-10 ENCOUNTER — Encounter: Payer: Medicare Other | Admitting: Nurse Practitioner

## 2015-04-28 ENCOUNTER — Encounter: Payer: Medicare Other | Admitting: Nurse Practitioner

## 2015-05-14 ENCOUNTER — Ambulatory Visit (INDEPENDENT_AMBULATORY_CARE_PROVIDER_SITE_OTHER): Payer: Medicare Other | Admitting: Family Medicine

## 2015-05-14 ENCOUNTER — Encounter: Payer: Self-pay | Admitting: Family Medicine

## 2015-05-14 VITALS — BP 126/82 | Ht 61.0 in | Wt 173.0 lb

## 2015-05-14 DIAGNOSIS — K219 Gastro-esophageal reflux disease without esophagitis: Secondary | ICD-10-CM

## 2015-05-14 DIAGNOSIS — F329 Major depressive disorder, single episode, unspecified: Secondary | ICD-10-CM | POA: Diagnosis not present

## 2015-05-14 DIAGNOSIS — Z79899 Other long term (current) drug therapy: Secondary | ICD-10-CM

## 2015-05-14 DIAGNOSIS — F32A Depression, unspecified: Secondary | ICD-10-CM

## 2015-05-14 DIAGNOSIS — R5383 Other fatigue: Secondary | ICD-10-CM

## 2015-05-14 DIAGNOSIS — E785 Hyperlipidemia, unspecified: Secondary | ICD-10-CM

## 2015-05-14 DIAGNOSIS — G35 Multiple sclerosis: Secondary | ICD-10-CM | POA: Diagnosis not present

## 2015-05-14 DIAGNOSIS — I10 Essential (primary) hypertension: Secondary | ICD-10-CM

## 2015-05-14 MED ORDER — DESVENLAFAXINE SUCCINATE ER 100 MG PO TB24: ORAL_TABLET | ORAL | Status: DC

## 2015-05-14 MED ORDER — AMPHETAMINE-DEXTROAMPHETAMINE 10 MG PO TABS: 10.0000 mg | ORAL_TABLET | ORAL | Status: DC | PRN

## 2015-05-14 MED ORDER — DOXEPIN HCL 50 MG PO CAPS: 50.0000 mg | ORAL_CAPSULE | Freq: Every day | ORAL | Status: DC

## 2015-05-14 MED ORDER — RANITIDINE HCL 300 MG PO TABS: 300.0000 mg | ORAL_TABLET | Freq: Every day | ORAL | Status: DC

## 2015-05-14 MED ORDER — CLONAZEPAM 0.5 MG PO TABS: ORAL_TABLET | ORAL | Status: DC

## 2015-05-14 NOTE — Progress Notes (Signed)
   Subjective:    Patient ID: Jasmine Patton, female    DOB: 1950-12-13, 65 y.o.   MRN: [id number]  HPI Patient arrives for long discussion concerning her numerous concerns   Patient arrives for a follow up on her meds. Patient states she is not taking her adhd med everyday because it makes her hyper and she cant sleep. Patient still uses it on a when necessary basis Patient reports no other problems or concerns.  Saw neuro specialist in octr for MS, was told did not need medication as far as injec  Will have MRI soon, and they will decide to do addtnl med then if necessry  D3 supp for vitamin d, wonders about the over-the-counter dose itching.  Gets anxiety and trouble seeping at times  Trying to get off the omeprazole, would like to stop it, gets reflux symptoms several days per week.  Continues to experience ongoing fatigue and tiredness. She feels likely due to her MS but not 100% sure.  Continue 6 French depression. Feels that the Pristiq is definitely helping her. Would like to stay on it for sure also uses the Klonopin as needed for anxiety and sleep.  Uses pepcid off and on  Wants to try zantac 300  Trying to exdrcise two days per wk   History of hyperlipidemia. Patient watching diet best she can   Review of Systems No headache no chest pain no back pain abdominal pain no change in bowel habits    Objective:   Physical Exam Alert vitals stable. HEENT normal. Lungs clear. Heart regular rate and rhythm. Ankles without edema       Assessment & Plan:  Impression 1 ADHD with when necessary medicine usage discussed #2 insomnia ongoing discussed with need for meds #3 depression stable on medication #4 reflux patient would like to back off from omeprazole to ranitidine or equivalent discussed plan change ranitidine. Appropriate blood work. Diet exercise discussed. Further recommendations based on blood work result WSL

## 2015-07-17 LAB — HEPATIC FUNCTION PANEL
ALK PHOS: 80 IU/L (ref 39–117)
ALT: 27 IU/L (ref 0–32)
AST: 23 IU/L (ref 0–40)
Albumin: 4.6 g/dL (ref 3.6–4.8)
BILIRUBIN TOTAL: 0.4 mg/dL (ref 0.0–1.2)
BILIRUBIN, DIRECT: 0.11 mg/dL (ref 0.00–0.40)
Total Protein: 6.9 g/dL (ref 6.0–8.5)

## 2015-07-17 LAB — LIPID PANEL
CHOL/HDL RATIO: 4.1 ratio (ref 0.0–4.4)
Cholesterol, Total: 258 mg/dL — ABNORMAL HIGH (ref 100–199)
HDL: 63 mg/dL (ref >39)
LDL Calculated: 170 mg/dL — ABNORMAL HIGH (ref 0–99)
TRIGLYCERIDES: 127 mg/dL (ref 0–149)
VLDL Cholesterol Cal: 25 mg/dL (ref 5–40)

## 2015-07-17 LAB — BASIC METABOLIC PANEL
BUN / CREAT RATIO: 12 (ref 12–28)
BUN: 11 mg/dL (ref 8–27)
CO2: 27 mmol/L (ref 18–29)
CREATININE: 0.91 mg/dL (ref 0.57–1.00)
Calcium: 9.3 mg/dL (ref 8.7–10.3)
Chloride: 97 mmol/L (ref 96–106)
GFR, EST AFRICAN AMERICAN: 77 mL/min/{1.73_m2} (ref >59)
GFR, EST NON AFRICAN AMERICAN: 67 mL/min/{1.73_m2} (ref >59)
Glucose: 99 mg/dL (ref 65–99)
POTASSIUM: 4.1 mmol/L (ref 3.5–5.2)
SODIUM: 140 mmol/L (ref 134–144)

## 2015-07-17 LAB — TSH: TSH: 2.78 u[IU]/mL (ref 0.450–4.500)

## 2015-07-19 ENCOUNTER — Encounter: Payer: Self-pay | Admitting: Family Medicine

## 2015-11-11 ENCOUNTER — Other Ambulatory Visit: Payer: Self-pay | Admitting: *Deleted

## 2015-11-11 ENCOUNTER — Other Ambulatory Visit: Payer: Self-pay | Admitting: Family Medicine

## 2015-11-11 MED ORDER — CLONAZEPAM 0.5 MG PO TABS: ORAL_TABLET | ORAL | 1 refills | Status: DC

## 2015-11-11 NOTE — Telephone Encounter (Signed)
May refill this plus one additional 

## 2015-12-22 ENCOUNTER — Telehealth: Payer: Self-pay | Admitting: Family Medicine

## 2015-12-22 ENCOUNTER — Other Ambulatory Visit: Payer: Self-pay | Admitting: Family Medicine

## 2015-12-22 MED ORDER — DOXEPIN HCL 50 MG PO CAPS: 50.0000 mg | ORAL_CAPSULE | Freq: Every day | ORAL | 0 refills | Status: DC

## 2015-12-22 NOTE — Telephone Encounter (Signed)
Left message return call 12/22/15

## 2015-12-22 NOTE — Telephone Encounter (Signed)
Pt is needing refills on her doxepin (SINEQUAN) 50 MG capsule Pt has an appt scheduled for next week.    Leonard

## 2015-12-22 NOTE — Telephone Encounter (Signed)
Ok times one 

## 2015-12-29 ENCOUNTER — Ambulatory Visit (INDEPENDENT_AMBULATORY_CARE_PROVIDER_SITE_OTHER): Payer: Medicare Other | Admitting: Family Medicine

## 2015-12-29 ENCOUNTER — Encounter: Payer: Self-pay | Admitting: Family Medicine

## 2015-12-29 VITALS — BP 124/80 | Ht 60.0 in | Wt 172.0 lb

## 2015-12-29 DIAGNOSIS — K219 Gastro-esophageal reflux disease without esophagitis: Secondary | ICD-10-CM | POA: Diagnosis not present

## 2015-12-29 DIAGNOSIS — I10 Essential (primary) hypertension: Secondary | ICD-10-CM

## 2015-12-29 DIAGNOSIS — F3289 Other specified depressive episodes: Secondary | ICD-10-CM

## 2015-12-29 DIAGNOSIS — G35 Multiple sclerosis: Secondary | ICD-10-CM

## 2015-12-29 DIAGNOSIS — Z23 Encounter for immunization: Secondary | ICD-10-CM | POA: Diagnosis not present

## 2015-12-29 MED ORDER — AMPHETAMINE-DEXTROAMPHETAMINE 10 MG PO TABS: 10.0000 mg | ORAL_TABLET | ORAL | 0 refills | Status: DC | PRN

## 2015-12-29 MED ORDER — CLONAZEPAM 0.5 MG PO TABS: ORAL_TABLET | ORAL | 5 refills | Status: DC

## 2015-12-29 MED ORDER — RANITIDINE HCL 300 MG PO TABS: 300.0000 mg | ORAL_TABLET | Freq: Every day | ORAL | 5 refills | Status: DC

## 2015-12-29 MED ORDER — DOXEPIN HCL 50 MG PO CAPS: 50.0000 mg | ORAL_CAPSULE | Freq: Every day | ORAL | 5 refills | Status: DC

## 2015-12-29 MED ORDER — PROGESTERONE MICRONIZED POWD: 5 refills | Status: DC

## 2015-12-29 NOTE — Progress Notes (Signed)
   Subjective:    Patient ID: Jasmine Patton, female    DOB: 07/22/1950, 65 y.o.   MRN: [id number] Patient arrives to office for follow-up of numerous concerns HPIMed check up.   Reflux. Ranitidine not helping. Belching, fullness in chest.  backong off has not helped, ranit not providing enpough control   ADD. Takes Adderall as needed not every day. When she needs it does help considerably.  Needs refills on all meds.   Wants refill on progesterone powder. Started by [name] and notes improvement with it.  Saw neurologist. Saw them in sept, fro mri result they trec sticking with same med for now Will start new MS drug in October. 124 45  New med that helps balance coming out and t may try soosn  Pt has challenges with epr, overall stable, just days of feeling overwhelmed     Flu vaccine today.     Review of Systems No headache, no major weight loss or weight gain, no chest pain no back pain abdominal pain no change in bowel habits complete ROS otherwise negative     Objective:   Physical Exam Alert vitals stable, NAD. Blood pressure good on repeat. HEENT normal. Lungs clear. Heart regular rate and rhythm.        Assessment & Plan:  Impression 1 reflux suboptimum current meds increased omeprazole daily rationale discussed #2 depression anxiety complicated by #3 patient benefits from medications #3 MS followed by specialist #4 ADHD ongoing with when necessary use of Adderall helpful #5 hormone supplement maintain per patient request plan flu shot today. All medications refilled. Diet exercise discussed. WSL

## 2015-12-31 ENCOUNTER — Other Ambulatory Visit: Payer: Self-pay | Admitting: Family Medicine

## 2016-02-09 ENCOUNTER — Other Ambulatory Visit: Payer: Self-pay | Admitting: Family Medicine

## 2016-03-29 ENCOUNTER — Telehealth: Payer: Self-pay | Admitting: Family Medicine

## 2016-03-29 NOTE — Telephone Encounter (Signed)
Patient is needing a note excusing her from jury duty on April 12, 2015.  She said she has MS.  She will need this turned in by this Friday 04/01/16.

## 2016-03-30 NOTE — Telephone Encounter (Signed)
Patient came by asking about note for jury duty. Stated she needs it by the 1st.

## 2016-03-31 NOTE — Telephone Encounter (Signed)
done

## 2016-03-31 NOTE — Telephone Encounter (Signed)
Pt notified on voicemail letter is ready for pickup at front window.  Copy sent to be scanned.

## 2016-03-31 NOTE — Telephone Encounter (Signed)
Patient called to check on this.  She said she needs to get this to the clerk of court ASAP and will need to pick up tomorrow.

## 2016-06-10 ENCOUNTER — Telehealth: Payer: Self-pay | Admitting: Family Medicine

## 2016-06-10 ENCOUNTER — Other Ambulatory Visit: Payer: Self-pay | Admitting: Family Medicine

## 2016-06-10 DIAGNOSIS — Z1231 Encounter for screening mammogram for malignant neoplasm of breast: Secondary | ICD-10-CM

## 2016-06-10 DIAGNOSIS — Z1211 Encounter for screening for malignant neoplasm of colon: Secondary | ICD-10-CM

## 2016-06-10 NOTE — Telephone Encounter (Signed)
Pt left message on my voicemail Requesting referral for Dr. Laural Golden for colonoscopy States she'd like to change from Dr. Gala Romney to Dr. Laural Golden  Please initiate in system so that I may process

## 2016-06-10 NOTE — Telephone Encounter (Signed)
Referral put in.

## 2016-06-13 ENCOUNTER — Encounter: Payer: Self-pay | Admitting: Family Medicine

## 2016-06-14 ENCOUNTER — Encounter (INDEPENDENT_AMBULATORY_CARE_PROVIDER_SITE_OTHER): Payer: Self-pay | Admitting: *Deleted

## 2016-06-17 ENCOUNTER — Inpatient Hospital Stay (HOSPITAL_COMMUNITY): Admission: RE | Admit: 2016-06-17 | Payer: Medicare Other | Source: Ambulatory Visit

## 2016-06-30 ENCOUNTER — Other Ambulatory Visit: Payer: Self-pay | Admitting: Family Medicine

## 2016-07-01 NOTE — Telephone Encounter (Signed)
Ok six mo worth 

## 2016-07-13 ENCOUNTER — Ambulatory Visit (INDEPENDENT_AMBULATORY_CARE_PROVIDER_SITE_OTHER): Payer: Medicare Other | Admitting: Internal Medicine

## 2016-07-13 ENCOUNTER — Other Ambulatory Visit (INDEPENDENT_AMBULATORY_CARE_PROVIDER_SITE_OTHER): Payer: Self-pay | Admitting: Internal Medicine

## 2016-07-13 ENCOUNTER — Telehealth (INDEPENDENT_AMBULATORY_CARE_PROVIDER_SITE_OTHER): Payer: Self-pay | Admitting: *Deleted

## 2016-07-13 ENCOUNTER — Encounter (INDEPENDENT_AMBULATORY_CARE_PROVIDER_SITE_OTHER): Payer: Self-pay | Admitting: Internal Medicine

## 2016-07-13 ENCOUNTER — Encounter (INDEPENDENT_AMBULATORY_CARE_PROVIDER_SITE_OTHER): Payer: Self-pay | Admitting: *Deleted

## 2016-07-13 ENCOUNTER — Encounter (INDEPENDENT_AMBULATORY_CARE_PROVIDER_SITE_OTHER): Payer: Self-pay

## 2016-07-13 VITALS — BP 116/72 | HR 80 | Temp 97.8°F | Ht 61.0 in | Wt 176.0 lb

## 2016-07-13 DIAGNOSIS — Z800 Family history of malignant neoplasm of digestive organs: Secondary | ICD-10-CM

## 2016-07-13 DIAGNOSIS — K219 Gastro-esophageal reflux disease without esophagitis: Secondary | ICD-10-CM

## 2016-07-13 MED ORDER — PANTOPRAZOLE SODIUM 40 MG PO TBEC: 40.0000 mg | DELAYED_RELEASE_TABLET | Freq: Every day | ORAL | 5 refills | Status: DC

## 2016-07-13 MED ORDER — PEG 3350-KCL-NA BICARB-NACL 420 G PO SOLR: 4000.0000 mL | Freq: Once | ORAL | 0 refills | Status: AC

## 2016-07-13 NOTE — Progress Notes (Signed)
   Subjective:    Patient ID: Jasmine Patton, female    DOB: [birth year], 66 y.o.   MRN: [id number]  HPI  Presents today  She also c/o alternating. between diarrhea and constipation for the past year. Take Magnesium as need. She also says her acid reflux is worse. She has discomfort and she belches. She take Omeprazole for the acid reflux. She says sometimes she strangles on water or liquids.  No trouble with solids. Appetite is good. No weight loss. She usually has a BM about every 2 days.  If she becomes constipated she will take Magnesium.  Family hx of colon cancer in mother in her 76s.   Here last colonoscopy/EGD was in 2009 for positive famiy hx of colon cancer in a first degree relative (Mother in her 74s). Marland Kitchen Reefractory GERD.   IMPRESSION:  Esophagogastroduodenoscopy multiple distal esophageal  erosions, patulous esophagogastric junction.   Findings consistent with moderately severe erosive reflux esophagitis,  small hiatal hernia, otherwise normal stomach, D1 and D2.  Colonoscopy  findings, anal papilla and internal hemorrhoids, otherwise normal  rectum.  Somewhat tortuous elongated, but otherwise normal-appearing  colon. Review of Systems Past Medical History:  Diagnosis Date  . ADD (attention deficit disorder)    Adult  . Allergic rhinitis   . Anxiety   . Depression   . Hyperlipidemia   . Insomnia    Chronic  . MS (multiple sclerosis) ([place])   . Osteopenia   . Positive H. pylori test   . Reflux     Past Surgical History:  Procedure Laterality Date  . COLONOSCOPY    . ESOPHAGOGASTRODUODENOSCOPY      Allergies  Allergen Reactions  . Ambien [Zolpidem Tartrate]     Side effects   . Etodolac Rash    Current Outpatient Prescriptions on File Prior to Visit  Medication Sig Dispense Refill  . Cholecalciferol (VITAMIN D3) 5000 units CAPS Take by mouth.    . clonazePAM (KLONOPIN) 0.5 MG tablet TAKE ONE TABLET DAILY AT BEDTIME AS NEEDED FOR ANXIETY 30 tablet 5    . doxepin (SINEQUAN) 50 MG capsule Take 1 capsule (50 mg total) by mouth at bedtime. 30 capsule 5  . PRISTIQ 100 MG 24 hr tablet TAKE ONE (1) TABLET EACH DAY 30 tablet 5   No current facility-administered medications on file prior to visit.        Objective:   Physical Exam Blood pressure 116/72, pulse 80, temperature 97.8 F (36.6 C), height 5\' 1"  (1.549 m), weight 176 lb (79.8 kg). Alert and oriented. Skin warm and dry. Oral mucosa is moist.   . Sclera anicteric, conjunctivae is pink. Thyroid not enlarged. No cervical lymphadenopathy. Lungs clear. Heart regular rate and rhythm.  Abdomen is soft. Bowel sounds are positive. No hepatomegaly. No abdominal masses felt. No tenderness.  No edema to lower extremities. Patient is alert and oriented.       Assessment & Plan:  GERD. Rx for Protonix 40mg  daily. EGD. To rule out PUD. Family hx of colon cancer: Colonoscopy.

## 2016-07-13 NOTE — Patient Instructions (Signed)
EGD/Colonoscopy. The risks and benefits such as perforation, bleeding, and infection were reviewed with the patient and is agreeable. 

## 2016-07-13 NOTE — Telephone Encounter (Signed)
Patient needs trilyte 

## 2016-07-30 ENCOUNTER — Other Ambulatory Visit: Payer: Self-pay | Admitting: Family Medicine

## 2016-08-01 NOTE — Telephone Encounter (Signed)
Last seen 12/29/18

## 2016-10-04 ENCOUNTER — Other Ambulatory Visit: Payer: Self-pay | Admitting: Nurse Practitioner

## 2016-10-12 ENCOUNTER — Encounter (HOSPITAL_COMMUNITY): Payer: Self-pay | Admitting: Anesthesiology

## 2016-10-12 ENCOUNTER — Encounter (HOSPITAL_COMMUNITY): Payer: Self-pay | Admitting: *Deleted

## 2016-10-12 ENCOUNTER — Ambulatory Visit (HOSPITAL_COMMUNITY)
Admission: RE | Admit: 2016-10-12 | Discharge: 2016-10-12 | Disposition: A | Discharge status: Home / Self Care | Payer: Medicare Other | Source: Ambulatory Visit | Attending: Internal Medicine | Admitting: Internal Medicine

## 2016-10-12 ENCOUNTER — Encounter (HOSPITAL_COMMUNITY)
Admission: RE | Disposition: A | Discharge status: Home / Self Care | Payer: Self-pay | Source: Ambulatory Visit | Attending: Internal Medicine

## 2016-10-12 DIAGNOSIS — K5900 Constipation, unspecified: Secondary | ICD-10-CM | POA: Diagnosis not present

## 2016-10-12 DIAGNOSIS — E785 Hyperlipidemia, unspecified: Secondary | ICD-10-CM | POA: Diagnosis not present

## 2016-10-12 DIAGNOSIS — F419 Anxiety disorder, unspecified: Secondary | ICD-10-CM | POA: Diagnosis not present

## 2016-10-12 DIAGNOSIS — D120 Benign neoplasm of cecum: Secondary | ICD-10-CM | POA: Insufficient documentation

## 2016-10-12 DIAGNOSIS — K648 Other hemorrhoids: Secondary | ICD-10-CM | POA: Diagnosis not present

## 2016-10-12 DIAGNOSIS — Z79899 Other long term (current) drug therapy: Secondary | ICD-10-CM | POA: Insufficient documentation

## 2016-10-12 DIAGNOSIS — Z8619 Personal history of other infectious and parasitic diseases: Secondary | ICD-10-CM | POA: Insufficient documentation

## 2016-10-12 DIAGNOSIS — M8580 Other specified disorders of bone density and structure, unspecified site: Secondary | ICD-10-CM | POA: Diagnosis not present

## 2016-10-12 DIAGNOSIS — K210 Gastro-esophageal reflux disease with esophagitis: Secondary | ICD-10-CM | POA: Diagnosis not present

## 2016-10-12 DIAGNOSIS — F329 Major depressive disorder, single episode, unspecified: Secondary | ICD-10-CM | POA: Insufficient documentation

## 2016-10-12 DIAGNOSIS — K219 Gastro-esophageal reflux disease without esophagitis: Secondary | ICD-10-CM | POA: Diagnosis not present

## 2016-10-12 DIAGNOSIS — Z888 Allergy status to other drugs, medicaments and biological substances status: Secondary | ICD-10-CM | POA: Insufficient documentation

## 2016-10-12 DIAGNOSIS — Z8249 Family history of ischemic heart disease and other diseases of the circulatory system: Secondary | ICD-10-CM | POA: Diagnosis not present

## 2016-10-12 DIAGNOSIS — Z800 Family history of malignant neoplasm of digestive organs: Secondary | ICD-10-CM

## 2016-10-12 DIAGNOSIS — G35 Multiple sclerosis: Secondary | ICD-10-CM | POA: Insufficient documentation

## 2016-10-12 DIAGNOSIS — Z1211 Encounter for screening for malignant neoplasm of colon: Secondary | ICD-10-CM | POA: Diagnosis not present

## 2016-10-12 DIAGNOSIS — Z9889 Other specified postprocedural states: Secondary | ICD-10-CM | POA: Insufficient documentation

## 2016-10-12 DIAGNOSIS — G4700 Insomnia, unspecified: Secondary | ICD-10-CM | POA: Insufficient documentation

## 2016-10-12 DIAGNOSIS — K449 Diaphragmatic hernia without obstruction or gangrene: Secondary | ICD-10-CM | POA: Insufficient documentation

## 2016-10-12 HISTORY — DX: Gastro-esophageal reflux disease without esophagitis: K21.9

## 2016-10-12 HISTORY — PX: ESOPHAGOGASTRODUODENOSCOPY: SHX5428

## 2016-10-12 HISTORY — PX: COLONOSCOPY: SHX5424

## 2016-10-12 HISTORY — PX: POLYPECTOMY: SHX5525

## 2016-10-12 SURGERY — EGD (ESOPHAGOGASTRODUODENOSCOPY)
Anesthesia: Moderate Sedation

## 2016-10-12 MED ORDER — MEPERIDINE HCL 50 MG/ML IJ SOLN
INTRAMUSCULAR | Status: AC
  Filled 2016-10-12: qty 1

## 2016-10-12 MED ORDER — MIDAZOLAM HCL 5 MG/5ML IJ SOLN
INTRAMUSCULAR | Status: DC | PRN
  Administered 2016-10-12 (×4): 2 mg via INTRAVENOUS

## 2016-10-12 MED ORDER — SODIUM CHLORIDE 0.9 % IV SOLN
INTRAVENOUS | Status: DC
Start: 2016-10-12 — End: 2016-10-14
  Administered 2016-10-12: 1000 mL via INTRAVENOUS

## 2016-10-12 MED ORDER — MIDAZOLAM HCL 5 MG/5ML IJ SOLN
INTRAMUSCULAR | Status: AC
  Filled 2016-10-12: qty 10

## 2016-10-12 MED ORDER — MEPERIDINE HCL 50 MG/ML IJ SOLN
INTRAMUSCULAR | Status: DC | PRN
  Administered 2016-10-12: 25 mg via INTRAVENOUS
  Administered 2016-10-12: 25 mg
  Administered 2016-10-12: 25 mg via INTRAVENOUS

## 2016-10-12 MED ORDER — FAMOTIDINE 20 MG PO TABS: 20.0000 mg | ORAL_TABLET | Freq: Every day | ORAL | 5 refills | Status: DC

## 2016-10-12 MED ORDER — LIDOCAINE VISCOUS 2 % MT SOLN
OROMUCOSAL | Status: AC
  Filled 2016-10-12: qty 15

## 2016-10-12 NOTE — Op Note (Signed)
[facility] Patient Name: Jasmine Patton Procedure Date: 10/12/2016 12:10 PM MRN: [id number] Date of Birth: 04-Feb-1951 Attending MD: Hildred Laser , MD CSN: [id number] Age: 66 Admit Type: Outpatient Procedure:                Upper GI endoscopy Indications:              Follow-up of gastro-esophageal reflux disease Providers:                Hildred Laser, MD, Lurline Del, RN, Rosina Lowenstein, RN Referring MD:             Grace Bushy. Wolfgang Phoenix, MD Medicines:                Lidocaine spray, Meperidine 75 mg IV, Midazolam 10                            mg IV Complications:            No immediate complications. Estimated Blood Loss:     Estimated blood loss: none. Procedure:                Pre-Anesthesia Assessment:                           - Prior to the procedure, a History and Physical                            was performed, and patient medications and                            allergies were reviewed. The patient's tolerance of                            previous anesthesia was also reviewed. The risks                            and benefits of the procedure and the sedation                            options and risks were discussed with the patient.                            All questions were answered, and informed consent                            was obtained. Prior Anticoagulants: The patient                            last took ibuprofen 1 day prior to the procedure.                            ASA Grade Assessment: II - A patient with mild                            systemic disease. After reviewing the risks and  benefits, the patient was deemed in satisfactory                            condition to undergo the procedure.                           After obtaining informed consent, the endoscope was                            passed under direct vision. Throughout the                            procedure, the patient's blood pressure, pulse, and                           oxygen saturations were monitored continuously. The                            EG-[id number] ([id number]) scope was introduced through the                            mouth, and advanced to the second part of duodenum.                            The upper GI endoscopy was accomplished without                            difficulty. The patient tolerated the procedure                            well. Scope In: 1:12:46 PM Scope Out: 1:19:14 PM Total Procedure Duration: 0 hours 6 minutes 28 seconds  Findings:      The proximal esophagus and mid esophagus were normal.      LA Grade B (one or more mucosal breaks greater than 5 mm, not extending       between the tops of two mucosal folds) esophagitis was found 30 to 33 cm       from the incisors.      The Z-line was regular and was found 33 cm from the incisors.      A 3 cm hiatal hernia was present.      The entire examined stomach was normal.      The duodenal bulb and second portion of the duodenum were normal. Impression:               - Normal proximal esophagus and mid esophagus.                           - LA Grade B reflux esophagitis.                           - Z-line regular, 33 cm from the incisors.                           - 3 cm hiatal hernia.                           -  Normal stomach.                           - Normal duodenal bulb and second portion of the                            duodenum.                           - No specimens collected. Moderate Sedation:      Moderate (conscious) sedation was administered by the endoscopy nurse       and supervised by the endoscopist. The following parameters were       monitored: oxygen saturation, heart rate, blood pressure, CO2       capnography and response to care. Total physician intraservice time was       15 minutes. Recommendation:           - Patient has a contact number available for                            emergencies. The signs and symptoms of  potential                            delayed complications were discussed with the                            patient. Return to normal activities tomorrow.                            Written discharge instructions were provided to the                            patient.                           - Resume previous diet today.                           - Continue present medications.                           - FamotidineOTC 20 mg by mouth daily at bedtime.                           - Return to GI clinic in 3 months. Procedure Code(s):        --- Professional ---                           [phone], Esophagogastroduodenoscopy, flexible,                            transoral; diagnostic, including collection of                            specimen(s) by brushing or washing, when performed                            (  separate procedure)                           99152, Moderate sedation services provided by the                            same physician or other qualified health care                            professional performing the diagnostic or                            therapeutic service that the sedation supports,                            requiring the presence of an independent trained                            observer to assist in the monitoring of the                            patient's level of consciousness and physiological                            status; initial 15 minutes of intraservice time,                            patient age 70 years or older Diagnosis Code(s):        --- Professional ---                           K21.0, Gastro-esophageal reflux disease with                            esophagitis                           K44.9, Diaphragmatic hernia without obstruction or                            gangrene CPT copyright 2016 American Medical Association. All rights reserved. The codes documented in this report are preliminary and upon coder review may  be revised  to meet current compliance requirements. Hildred Laser, MD Hildred Laser, MD 10/12/2016 1:54:44 PM This report has been signed electronically. Number of Addenda: 0

## 2016-10-12 NOTE — H&P (Signed)
Jasmine Patton is an 66 y.o. female.   Chief Complaint: Patient is here for EGD and colonoscopy. HPI: Patient is 66 year old Caucasian female who is here for diagnostic EGD and screening colonoscopy. Patient has over 10 history of GERD. Was document have erosive esophagitis. She began to have frequent breakthrough symptoms. She is doing some better with change in her PPI. She denies solid food dysphagia. She occasionally strangles with liquids she believes maybe related to her MS. She denies melena. She also denies rectal bleeding. She's had intermittent constipation lately. Last colonoscopy was 9 years ago revealing external hemorrhoids. Family history significant for CRC in mother who was in 53s at the time of diagnosis and 105 is later developed breast carcinoma.  Past Medical History:  Diagnosis Date  . ADD (attention deficit disorder)    Adult  . Allergic rhinitis   . Anxiety   . Depression   . GERD (gastroesophageal reflux disease)   . Hyperlipidemia   . Insomnia    Chronic  . MS (multiple sclerosis) ([place])   . Osteopenia   . Positive H. pylori test   . Reflux     Past Surgical History:  Procedure Laterality Date  . COLONOSCOPY    . Cyst removed from left breast    . DILATION AND CURETTAGE OF UTERUS    . ESOPHAGOGASTRODUODENOSCOPY      Family History  Problem Relation Age of Onset  . Cancer Mother        Colon  . Colon cancer Mother   . Hypertension Father    Social History:  reports that she has never smoked. She has never used smokeless tobacco. She reports that she drinks alcohol. She reports that she does not use drugs.  Allergies:  Allergies  Allergen Reactions  . Ambien [Zolpidem Tartrate]     Side effects   . Etodolac Rash    Medications Prior to Admission  Medication Sig Dispense Refill  . clonazePAM (KLONOPIN) 0.5 MG tablet TAKE ONE TABLET DAILY AT BEDTIME AS NEEDED FOR ANXIETY 30 tablet 5  . cyanocobalamin 100 MCG tablet Take 100 mcg by mouth daily.     Marland Kitchen doxepin (SINEQUAN) 50 MG capsule TAKE ONE CAPSULE BY MOUTH AT BEDTIME 30 capsule 2  . Melatonin 300 MCG TABS Take 300 mcg by mouth at bedtime.    Marland Kitchen PRISTIQ 100 MG 24 hr tablet TAKE ONE (1) TABLET EACH DAY 30 tablet 2  . Probiotic Product (ACIDOPHILUS PEARLS PO) Take 1 capsule by mouth daily.    . Cholecalciferol 4000 units CAPS Take 4,000 Units by mouth daily.    Marland Kitchen ibuprofen (ADVIL,MOTRIN) 200 MG tablet Take 400 mg by mouth 2 (two) times daily as needed for moderate pain.    . pantoprazole (PROTONIX) 40 MG tablet Take 1 tablet (40 mg total) by mouth daily. 30 minutes before breakfast 30 tablet 5    No results found for this or any previous visit (from the past 48 hour(s)). No results found.  ROS  Blood pressure (!) 147/65, pulse 94, temperature 98.6 F (37 C), temperature source Oral, resp. rate 20, height 5\' 1"  (1.549 m), weight 170 lb (77.1 kg), SpO2 98 %. Physical Exam  Constitutional: She appears well-developed and well-nourished.  HENT:  Mouth/Throat: Oropharynx is clear and moist.  Eyes: Conjunctivae are normal. No scleral icterus.  Neck: No thyromegaly present.  Cardiovascular: Normal rate, regular rhythm and normal heart sounds.   No murmur heard. Respiratory: Effort normal and breath sounds normal.  GI:  Symmetrical soft and nontender without organomegaly or masses  Musculoskeletal: She exhibits no edema.  Lymphadenopathy:    She has no cervical adenopathy.  Neurological: She is alert.  Skin: Skin is warm and dry.     Assessment/Plan Chronic GERD with unsatisfactory response to therapy. Diagnostic EGD and high risk screening colonoscopy.  Hildred Laser, MD 10/12/2016, 12:59 PM

## 2016-10-12 NOTE — Discharge Instructions (Signed)
Resume usual medications and diet. Pepcid OTC or famotidine 20 mg by mouth daily at bedtime. No driving for 24 hours. Physician will call with biopsy result. Office visit in 3 months.  Esophagogastroduodenoscopy, Care After Refer to this sheet in the next few weeks. These instructions provide you with information about caring for yourself after your procedure. Your health care provider may also give you more specific instructions. Your treatment has been planned according to current medical practices, but problems sometimes occur. Call your health care provider if you have any problems or questions after your procedure. What can I expect after the procedure? After the procedure, it is common to have:  A sore throat.  Nausea.  Bloating.  Dizziness.  Fatigue.  Follow these instructions at home:  Do not eat or drink anything until the numbing medicine (local anesthetic) has worn off and your gag reflex has returned. You will know that the local anesthetic has worn off when you can swallow comfortably.  Do not drive for 24 hours if you received a medicine to help you relax (sedative).  If your health care provider took a tissue sample for testing during the procedure, make sure to get your test results. This is your responsibility. Ask your health care provider or the department performing the test when your results will be ready.  Keep all follow-up visits as told by your health care provider. This is important. Contact a health care provider if:  You cannot stop coughing.  You are not urinating.  You are urinating less than usual. Get help right away if:  You have trouble swallowing.  You cannot eat or drink.  You have throat or chest pain that gets worse.  You are dizzy or light-headed.  You faint.  You have nausea or vomiting.  You have chills.  You have a fever.  You have severe abdominal pain.  You have black, tarry, or bloody stools. This information is  not intended to replace advice given to you by your health care provider. Make sure you discuss any questions you have with your health care provider. Document Released: 03/07/2012 Document Revised: 08/27/2015 Document Reviewed: 02/12/2015 Elsevier Interactive Patient Education  2018 [employer].   Esophagitis Esophagitis is inflammation of the esophagus. The esophagus is the tube that carries food and liquids from your mouth to your stomach. Esophagitis can cause soreness or pain in the esophagus. This condition can make it difficult and painful to swallow. What are the causes? Most causes of esophagitis are not serious. Common causes of this condition include:  Gastroesophageal reflux disease (GERD). This is when stomach contents move back up into the esophagus (reflux).  Repeated vomiting.  An allergic-type reaction, especially caused by food allergies (eosinophilic esophagitis).  Injury to the esophagus by swallowing large pills with or without water, or swallowing certain types of medicines.  Swallowing (ingesting) harmful chemicals, such as household cleaning products.  Heavy alcohol use.  An infection of the esophagus.This most often occurs in people who have a weakened immune system.  Radiation or chemotherapy treatment for cancer.  Certain diseases such as sarcoidosis, Crohn disease, and scleroderma.  What are the signs or symptoms? Symptoms of this condition include:  Difficult or painful swallowing.  Pain with swallowing acidic liquids, such as citrus juices.  Pain with burping.  Chest pain.  Difficulty breathing.  Nausea.  Vomiting.  Pain in the abdomen.  Weight loss.  Ulcers in the mouth.  Patches of white material in the mouth (candidiasis).  Fever.  Coughing up blood or vomiting blood.  Stool that is black, tarry, or bright red.  How is this diagnosed? Your health care provider will take a medical history and perform a physical exam. You  may also have other tests, including:  An endoscopy to examine your stomach and esophagus with a small camera.  A test that measures the acidity level in your esophagus.  A test that measures how much pressure is on your esophagus.  A barium swallow or modified barium swallow to show the shape, size, and functioning of your esophagus.  Allergy tests.  How is this treated? Treatment for this condition depends on the cause of your esophagitis. In some cases, steroids or other medicines may be given to help relieve your symptoms or to treat the underlying cause of your condition. You may have to make some lifestyle changes, such as:  Avoiding alcohol.  Quitting smoking.  Changing your diet.  Exercising.  Changing your sleep habits and your sleep environment.  Follow these instructions at home: Take these actions to decrease your discomfort and to help avoid complications. Diet  Follow a diet as recommended by your health care provider. This may involve avoiding foods and drinks such as: ? Coffee and tea (with or without caffeine). ? Drinks that contain alcohol. ? Energy drinks and sports drinks. ? Carbonated drinks or sodas. ? Chocolate and cocoa. ? Peppermint and mint flavorings. ? Garlic and onions. ? Horseradish. ? Spicy and acidic foods, including peppers, chili powder, curry powder, vinegar, hot sauces, and barbecue sauce. ? Citrus fruit juices and citrus fruits, such as oranges, lemons, and limes. ? Tomato-based foods, such as red sauce, chili, salsa, and pizza with red sauce. ? Fried and fatty foods, such as donuts, french fries, potato chips, and high-fat dressings. ? High-fat meats, such as hot dogs and fatty cuts of red and white meats, such as rib eye steak, sausage, ham, and bacon. ? High-fat dairy items, such as whole milk, butter, and cream cheese.  Eat small, frequent meals instead of large meals.  Avoid drinking large amounts of liquid with your  meals.  Avoid eating meals during the 2-3 hours before bedtime.  Avoid lying down right after you eat.  Do not exercise right after you eat.  Avoid foods and drinks that seem to make your symptoms worse. General instructions  Pay attention to any changes in your symptoms.  Take over-the-counter and prescription medicines only as told by your health care provider. Do not take aspirin, ibuprofen, or other NSAIDs unless your health care provider told you to do so.  If you have trouble taking pills, use a pill splitter to decrease the size of the pill. This will decrease the chance of the pill getting stuck or injuring your esophagus on the way down. Also, drink water after you take a pill.  Do not use any tobacco products, including cigarettes, chewing tobacco, and e-cigarettes. If you need help quitting, ask your health care provider.  Wear loose-fitting clothing. Do not wear anything tight around your waist that causes pressure on your abdomen.  Raise (elevate) the head of your bed about 6 inches (15 cm).  Try to reduce your stress, such as with yoga or meditation. If you need help reducing stress, ask your health care provider.  If you are overweight, reduce your weight to an amount that is healthy for you. Ask your health care provider for guidance about a safe weight loss goal.  Keep all follow-up  visits as told by your health care provider. This is important. Contact a health care provider if:  You have new symptoms.  You have unexplained weight loss.  You have difficulty swallowing, or it hurts to swallow.  You have wheezing or a persistent cough.  Your symptoms do not improve with treatment.  You have frequent heartburn for more than two weeks. Get help right away if:  You have severe pain in your arms, neck, jaw, teeth, or back.  You feel sweaty, dizzy, or light-headed.  You have chest pain or shortness of breath.  You vomit and your vomit looks like blood or  coffee grounds.  Your stool is bloody or black.  You have a fever.  You cannot swallow, drink, or eat. This information is not intended to replace advice given to you by your health care provider. Make sure you discuss any questions you have with your health care provider. Document Released: 04/28/2004 Document Revised: 08/27/2015 Document Reviewed: 07/16/2014 Elsevier Interactive Patient Education  2018 [employer].   Colonoscopy, Adult, Care After This sheet gives you information about how to care for yourself after your procedure. Your health care provider may also give you more specific instructions. If you have problems or questions, contact your health care provider. What can I expect after the procedure? After the procedure, it is common to have:  A small amount of blood in your stool for 24 hours after the procedure.  Some gas.  Mild abdominal cramping or bloating.  Follow these instructions at home: General instructions   For the first 24 hours after the procedure: ? Do not drive or use machinery. ? Do not sign important documents. ? Do not drink alcohol. ? Do your regular daily activities at a slower pace than normal. ? Eat soft, easy-to-digest foods. ? Rest often.  Take over-the-counter or prescription medicines only as told by your health care provider.  It is up to you to get the results of your procedure. Ask your health care provider, or the department performing the procedure, when your results will be ready. Relieving cramping and bloating  Try walking around when you have cramps or feel bloated.  Apply heat to your abdomen as told by your health care provider. Use a heat source that your health care provider recommends, such as a moist heat pack or a heating pad. ? Place a towel between your skin and the heat source. ? Leave the heat on for 20-30 minutes. ? Remove the heat if your skin turns bright red. This is especially important if you are unable to  feel pain, heat, or cold. You may have a greater risk of getting burned. Eating and drinking  Drink enough fluid to keep your urine clear or pale yellow.  Resume your normal diet as instructed by your health care provider. Avoid heavy or fried foods that are hard to digest.  Avoid drinking alcohol for as long as instructed by your health care provider. Contact a health care provider if:  You have blood in your stool 2-3 days after the procedure. Get help right away if:  You have more than a small spotting of blood in your stool.  You pass large blood clots in your stool.  Your abdomen is swollen.  You have nausea or vomiting.  You have a fever.  You have increasing abdominal pain that is not relieved with medicine. This information is not intended to replace advice given to you by your health care provider. Make sure  you discuss any questions you have with your health care provider. Document Released: 11/03/2003 Document Revised: 12/14/2015 Document Reviewed: 06/02/2015 Elsevier Interactive Patient Education  2018 [employer].   Colon Polyps Polyps are tissue growths inside the body. Polyps can grow in many places, including the large intestine (colon). A polyp may be a round bump or a mushroom-shaped growth. You could have one polyp or several. Most colon polyps are noncancerous (benign). However, some colon polyps can become cancerous over time. What are the causes? The exact cause of colon polyps is not known. What increases the risk? This condition is more likely to develop in people who:  Have a family history of colon cancer or colon polyps.  Are older than 40 or older than 45 if they are African American.  Have inflammatory bowel disease, such as ulcerative colitis or Crohn disease.  Are overweight.  Smoke cigarettes.  Do not get enough exercise.  Drink too much alcohol.  Eat a diet that is: ? High in fat and red meat. ? Low in fiber.  Had childhood  cancer that was treated with abdominal radiation.  What are the signs or symptoms? Most polyps do not cause symptoms. If you have symptoms, they may include:  Blood coming from your rectum when having a bowel movement.  Blood in your stool.The stool may look dark red or black.  A change in bowel habits, such as constipation or diarrhea.  How is this diagnosed? This condition is diagnosed with a colonoscopy. This is a procedure that uses a lighted, flexible scope to look at the inside of your colon. How is this treated? Treatment for this condition involves removing any polyps that are found. Those polyps will then be tested for cancer. If cancer is found, your health care provider will talk to you about options for colon cancer treatment. Follow these instructions at home: Diet  Eat plenty of fiber, such as fruits, vegetables, and whole grains.  Eat foods that are high in calcium and vitamin D, such as milk, cheese, yogurt, eggs, liver, fish, and broccoli.  Limit foods high in fat, red meats, and processed meats, such as hot dogs, sausage, bacon, and lunch meats.  Maintain a healthy weight, or lose weight if recommended by your health care provider. General instructions  Do not smoke cigarettes.  Do not drink alcohol excessively.  Keep all follow-up visits as told by your health care provider. This is important. This includes keeping regularly scheduled colonoscopies. Talk to your health care provider about when you need a colonoscopy.  Exercise every day or as told by your health care provider. Contact a health care provider if:  You have new or worsening bleeding during a bowel movement.  You have new or increased blood in your stool.  You have a change in bowel habits.  You unexpectedly lose weight. This information is not intended to replace advice given to you by your health care provider. Make sure you discuss any questions you have with your health care  provider. Document Released: 12/16/2003 Document Revised: 08/27/2015 Document Reviewed: 02/09/2015 Elsevier Interactive Patient Education  [employer].

## 2016-10-12 NOTE — Op Note (Signed)
[facility] Patient Name: Jasmine Patton Procedure Date: 10/12/2016 1:21 PM MRN: [id number] Date of Birth: 1951-02-12 Attending MD: Hildred Laser , MD CSN: [id number] Age: 66 Admit Type: Outpatient Procedure:                Colonoscopy Indications:              Screening in patient at increased risk: Family                            history of 1st-degree relative with colorectal                            cancer before age 63 years Providers:                Hildred Laser, MD, Lurline Del, RN, Rosina Lowenstein, RN Referring MD:             Grace Bushy. Luking, MD Medicines:                Meperidine 25 mg IV Complications:            No immediate complications. Estimated Blood Loss:     Estimated blood loss was minimal. Procedure:                Pre-Anesthesia Assessment:                           - Prior to the procedure, a History and Physical                            was performed, and patient medications and                            allergies were reviewed. The patient's tolerance of                            previous anesthesia was also reviewed. The risks                            and benefits of the procedure and the sedation                            options and risks were discussed with the patient.                            All questions were answered, and informed consent                            was obtained. Prior Anticoagulants: The patient                            last took ibuprofen 1 day prior to the procedure.                            ASA Grade Assessment: II - A patient with mild  systemic disease. After reviewing the risks and                            benefits, the patient was deemed in satisfactory                            condition to undergo the procedure.                           After obtaining informed consent, the colonoscope                            was passed under direct vision. Throughout the             procedure, the patient's blood pressure, pulse, and                            oxygen saturations were monitored continuously. The                            [id number] ([id number]) scope was introduced through                            the anus and advanced to the the cecum, identified                            by appendiceal orifice and ileocecal valve. The                            colonoscopy was performed without difficulty. The                            patient tolerated the procedure well. The quality                            of the bowel preparation was good. The ileocecal                            valve, appendiceal orifice, and rectum were                            photographed. Scope In: 1:22:09 PM Scope Out: 1:43:51 PM Scope Withdrawal Time: 0 hours 8 minutes 10 seconds  Total Procedure Duration: 0 hours 21 minutes 42 seconds  Findings:      The perianal and digital rectal examinations were normal.      A small polyp was found in the cecum. The polyp was sessile. Biopsies       were taken with a cold forceps for histology.      The exam was otherwise normal throughout the examined colon.      Internal hemorrhoids were found during retroflexion. The hemorrhoids       were small. Impression:               - One small polyp in the cecum. Biopsied.                           -  Internal hemorrhoids. Moderate Sedation:      Moderate (conscious) sedation was administered by the endoscopy nurse       and supervised by the endoscopist. The following parameters were       monitored: oxygen saturation, heart rate, blood pressure, CO2       capnography and response to care. Total physician intraservice time was       24 minutes. Recommendation:           - Patient has a contact number available for                            emergencies. The signs and symptoms of potential                            delayed complications were discussed with the                             patient. Return to normal activities tomorrow.                            Written discharge instructions were provided to the                            patient.                           - Resume previous diet today.                           - Continue present medications.                           - Await pathology results.                           - Repeat colonoscopy in 5 years. Procedure Code(s):        --- Professional ---                           [phone], Colonoscopy, flexible; with biopsy, single                            or multiple                           99152, Moderate sedation services provided by the                            same physician or other qualified health care                            professional performing the diagnostic or                            therapeutic service that the sedation supports,  requiring the presence of an independent trained                            observer to assist in the monitoring of the                            patient's level of consciousness and physiological                            status; initial 15 minutes of intraservice time,                            patient age 50 years or older                           [phone], Moderate sedation services; each additional                            15 minutes intraservice time Diagnosis Code(s):        --- Professional ---                           Z80.0, Family history of malignant neoplasm of                            digestive organs                           D12.0, Benign neoplasm of cecum                           K64.8, Other hemorrhoids CPT copyright 2016 [employer]. All rights reserved. The codes documented in this report are preliminary and upon coder review may  be revised to meet current compliance requirements. Hildred Laser, MD Hildred Laser, MD 10/12/2016 2:01:04 PM This report has been signed electronically. Number  of Addenda: 0

## 2016-10-13 ENCOUNTER — Encounter (INDEPENDENT_AMBULATORY_CARE_PROVIDER_SITE_OTHER): Payer: Self-pay | Admitting: *Deleted

## 2016-10-14 ENCOUNTER — Encounter (HOSPITAL_COMMUNITY): Payer: Self-pay | Admitting: Internal Medicine

## 2016-10-26 ENCOUNTER — Ambulatory Visit (HOSPITAL_COMMUNITY): Payer: Medicare Other

## 2016-11-02 ENCOUNTER — Ambulatory Visit (HOSPITAL_COMMUNITY)
Admission: RE | Admit: 2016-11-02 | Discharge: 2016-11-02 | Disposition: A | Discharge status: Home / Self Care | Payer: Medicare Other | Source: Ambulatory Visit | Attending: Family Medicine | Admitting: Family Medicine

## 2016-11-02 DIAGNOSIS — Z1231 Encounter for screening mammogram for malignant neoplasm of breast: Secondary | ICD-10-CM | POA: Insufficient documentation

## 2016-11-04 ENCOUNTER — Other Ambulatory Visit: Payer: Self-pay | Admitting: *Deleted

## 2016-11-04 MED ORDER — DOXEPIN HCL 50 MG PO CAPS: 50.0000 mg | ORAL_CAPSULE | Freq: Every day | ORAL | 0 refills | Status: DC

## 2016-11-25 ENCOUNTER — Encounter: Payer: Self-pay | Admitting: Nurse Practitioner

## 2016-12-28 ENCOUNTER — Ambulatory Visit (INDEPENDENT_AMBULATORY_CARE_PROVIDER_SITE_OTHER): Payer: Medicare Other | Admitting: Nurse Practitioner

## 2016-12-28 ENCOUNTER — Encounter: Payer: Self-pay | Admitting: Nurse Practitioner

## 2016-12-28 VITALS — BP 126/80 | Ht 60.0 in | Wt 173.0 lb

## 2016-12-28 DIAGNOSIS — Z23 Encounter for immunization: Secondary | ICD-10-CM

## 2016-12-28 DIAGNOSIS — Z780 Asymptomatic menopausal state: Secondary | ICD-10-CM

## 2016-12-28 DIAGNOSIS — Z124 Encounter for screening for malignant neoplasm of cervix: Secondary | ICD-10-CM | POA: Diagnosis not present

## 2016-12-28 DIAGNOSIS — Z0000 Encounter for general adult medical examination without abnormal findings: Secondary | ICD-10-CM | POA: Diagnosis not present

## 2016-12-28 DIAGNOSIS — Z1151 Encounter for screening for human papillomavirus (HPV): Secondary | ICD-10-CM

## 2016-12-28 DIAGNOSIS — Z79899 Other long term (current) drug therapy: Secondary | ICD-10-CM

## 2016-12-28 DIAGNOSIS — F3289 Other specified depressive episodes: Secondary | ICD-10-CM

## 2016-12-28 DIAGNOSIS — E785 Hyperlipidemia, unspecified: Secondary | ICD-10-CM

## 2016-12-28 NOTE — Progress Notes (Addendum)
Subjective:    Patient ID: Jasmine Patton, female    DOB: Dec 06, 1950, 66 y.o.   MRN: [id number]  HPI presents for her wellness exam. Following low fat diet. No vaginal bleeding or pelvic pain. Same sexual partner. Limited activity due to MS; tries to stay active as possible. Gets regular skin cancer screening. Regular dental and vision exams.  Depression screen PHQ 2/9 12/28/2016  Decreased Interest 1  Down, Depressed, Hopeless 1  PHQ - 2 Score 2  Altered sleeping 1  Tired, decreased energy 1  Change in appetite 1  Feeling bad or failure about yourself  1  Trouble concentrating 1  Moving slowly or fidgety/restless 0  Suicidal thoughts 0  PHQ-9 Score 7  Difficult doing work/chores Very difficult   Some difficulty dealing with MS and fatigue. Some social isolation.     Review of Systems  Constitutional: Positive for fatigue. Negative for activity change and appetite change.  HENT: Negative for dental problem, ear pain, sinus pressure and sore throat.   Respiratory: Negative for cough, chest tightness, shortness of breath and wheezing.   Cardiovascular: Negative for chest pain.  Gastrointestinal: Negative for abdominal distention, abdominal pain, blood in stool, constipation, diarrhea, nausea and vomiting.  Genitourinary: Negative for difficulty urinating, dysuria, enuresis, frequency, genital sores, pelvic pain, urgency, vaginal bleeding and vaginal discharge.       Objective:   Physical Exam  Constitutional: She is oriented to person, place, and time. She appears well-developed. No distress.  HENT:  Right Ear: External ear normal.  Left Ear: External ear normal.  Mouth/Throat: Oropharynx is clear and moist.  Neck: Normal range of motion. Neck supple. No tracheal deviation present. No thyromegaly present.  Cardiovascular: Normal rate, regular rhythm and normal heart sounds.  Exam reveals no gallop.   No murmur heard. Pulmonary/Chest: Effort normal and breath sounds normal.  Right breast exhibits no inverted nipple, no mass, no skin change and no tenderness. Left breast exhibits no inverted nipple, no mass, no skin change and no tenderness. Breasts are symmetrical.  Abdominal: Soft. She exhibits no distension. There is no tenderness.  Genitourinary: Vagina normal and uterus normal. No vaginal discharge found.  Genitourinary Comments: External GU: no rashes or lesions. Vagina: sl pale, no discharge. Cervix normal in appearance. No CMT. Bimanual exam: no tenderness or obvious masses.   Musculoskeletal: She exhibits no edema.  Lymphadenopathy:    She has no cervical adenopathy.  Neurological: She is alert and oriented to person, place, and time.  Skin: Skin is warm and dry. No rash noted.  Psychiatric: She has a normal mood and affect. Her behavior is normal. Thought content normal.  Vitals reviewed. axillae no adenopathy.  Lipid profile dated 07/16/15: LDL 170; HDL 63.        Assessment & Plan:   Problem List Items Addressed This Visit      Other   Depression   Hyperlipidemia   Relevant Medications   rosuvastatin (CRESTOR) 10 MG tablet   Other Relevant Orders   Lipid panel    Other Visit Diagnoses    Annual physical exam    -  Primary   Relevant Orders   Pap IG and HPV (high risk) DNA detection (Completed)   DG Bone Density   Screening for cervical cancer       Relevant Orders   Pap IG and HPV (high risk) DNA detection (Completed)   Screening for HPV (human papillomavirus)       Relevant Orders  Pap IG and HPV (high risk) DNA detection (Completed)   Postmenopausal       Relevant Orders   DG Bone Density   Need for influenza vaccination       Relevant Orders   Flu Vaccine QUAD 36+ mos IM (Completed)   High risk medication use       Relevant Orders   Hepatic function panel      Given Rx for shingles vaccine. Increase activity as tolerated. Also recommend increased socialization outside of the home. Continue daily vitamin D.  Meds ordered  this encounter  Medications  . Biotin 100 MG/GM POWD    Sig: Take by mouth. Three times daily.  . rosuvastatin (CRESTOR) 10 MG tablet    Sig: Take 1 tablet (10 mg total) by mouth daily.    Dispense:  30 tablet    Refill:  2    Order Specific Question:   Supervising Provider    Answer:   Mikey Kirschner [[id number]]   Repeat lipid and liver profiles in 8-10 weeks. Reviewed potential adverse effects of Crestor, DC med and call if any problems. Return in about 1 year (around 12/28/2017) for physical.

## 2016-12-30 ENCOUNTER — Other Ambulatory Visit: Payer: Self-pay | Admitting: Family Medicine

## 2016-12-30 DIAGNOSIS — E785 Hyperlipidemia, unspecified: Secondary | ICD-10-CM | POA: Insufficient documentation

## 2016-12-30 LAB — PAP IG AND HPV HIGH-RISK
HPV, HIGH-RISK: NEGATIVE
PAP SMEAR COMMENT: 0

## 2016-12-30 MED ORDER — ROSUVASTATIN CALCIUM 10 MG PO TABS: 10.0000 mg | ORAL_TABLET | Freq: Every day | ORAL | 2 refills | Status: DC

## 2016-12-30 MED ORDER — DOXEPIN HCL 50 MG PO CAPS: 50.0000 mg | ORAL_CAPSULE | Freq: Every day | ORAL | 1 refills | Status: DC

## 2016-12-30 NOTE — Progress Notes (Signed)
Patient advised -Cholesterol med sent in and labs ordered; please have patient get labs in 8-10 weeks per [name]. Patient verbalized understanding.

## 2016-12-30 NOTE — Addendum Note (Signed)
Addended by: Nilda Simmer on: 12/30/2016 12:43 PM   Modules accepted: Orders

## 2017-01-02 ENCOUNTER — Other Ambulatory Visit: Payer: Self-pay | Admitting: Family Medicine

## 2017-01-02 ENCOUNTER — Encounter: Payer: Self-pay | Admitting: Nurse Practitioner

## 2017-01-02 NOTE — Telephone Encounter (Signed)
Last seen 12/28/16

## 2017-01-02 NOTE — Telephone Encounter (Signed)
Ok six mo 

## 2017-01-03 ENCOUNTER — Other Ambulatory Visit: Payer: Self-pay | Admitting: Nurse Practitioner

## 2017-01-03 MED ORDER — PRISTIQ 100 MG PO TB24: ORAL_TABLET | ORAL | 5 refills | Status: DC

## 2017-01-03 MED ORDER — CLONAZEPAM 0.5 MG PO TABS: ORAL_TABLET | ORAL | 5 refills | Status: DC

## 2017-01-03 NOTE — Progress Notes (Signed)
done

## 2017-01-17 ENCOUNTER — Ambulatory Visit (INDEPENDENT_AMBULATORY_CARE_PROVIDER_SITE_OTHER): Payer: Medicare Other | Admitting: Internal Medicine

## 2017-01-24 ENCOUNTER — Ambulatory Visit (INDEPENDENT_AMBULATORY_CARE_PROVIDER_SITE_OTHER): Payer: Medicare Other | Admitting: Internal Medicine

## 2017-01-24 ENCOUNTER — Encounter (INDEPENDENT_AMBULATORY_CARE_PROVIDER_SITE_OTHER): Payer: Self-pay | Admitting: Internal Medicine

## 2017-01-25 ENCOUNTER — Encounter: Payer: Medicare Other | Admitting: Nurse Practitioner

## 2017-04-06 ENCOUNTER — Other Ambulatory Visit: Payer: Self-pay | Admitting: Nurse Practitioner

## 2017-04-07 ENCOUNTER — Telehealth: Payer: Self-pay | Admitting: Nurse Practitioner

## 2017-04-07 NOTE — Telephone Encounter (Signed)
Left message to return call 

## 2017-04-07 NOTE — Telephone Encounter (Signed)
Sent in one more refill of her cholesterol med. Need her to get her labs done that are in the system to see if it is working.thanks.

## 2017-04-11 NOTE — Telephone Encounter (Signed)
Patient notified and stated she will have her blood work done in the next week.

## 2017-04-13 ENCOUNTER — Encounter: Payer: Self-pay | Admitting: Nurse Practitioner

## 2017-04-13 DIAGNOSIS — R52 Pain, unspecified: Secondary | ICD-10-CM

## 2017-04-26 ENCOUNTER — Encounter: Payer: Self-pay | Admitting: Nurse Practitioner

## 2017-04-26 LAB — LIPID PANEL
CHOLESTEROL TOTAL: 165 mg/dL (ref 100–199)
Chol/HDL Ratio: 2.8 ratio (ref 0.0–4.4)
HDL: 58 mg/dL (ref >39)
LDL CALC: 81 mg/dL (ref 0–99)
TRIGLYCERIDES: 131 mg/dL (ref 0–149)
VLDL Cholesterol Cal: 26 mg/dL (ref 5–40)

## 2017-04-26 LAB — HEPATIC FUNCTION PANEL
ALK PHOS: 76 IU/L (ref 39–117)
ALT: 17 IU/L (ref 0–32)
AST: 19 IU/L (ref 0–40)
Albumin: 4.4 g/dL (ref 3.6–4.8)
BILIRUBIN TOTAL: 0.2 mg/dL (ref 0.0–1.2)
BILIRUBIN, DIRECT: 0.08 mg/dL (ref 0.00–0.40)
Total Protein: 6.7 g/dL (ref 6.0–8.5)

## 2017-04-26 LAB — C-REACTIVE PROTEIN: CRP: 2.3 mg/L (ref 0.0–4.9)

## 2017-04-27 ENCOUNTER — Other Ambulatory Visit: Payer: Self-pay | Admitting: Nurse Practitioner

## 2017-04-27 MED ORDER — ROSUVASTATIN CALCIUM 10 MG PO TABS: ORAL_TABLET | ORAL | 5 refills | Status: DC

## 2017-05-22 ENCOUNTER — Ambulatory Visit: Payer: Medicare Other | Admitting: Nurse Practitioner

## 2017-05-22 ENCOUNTER — Encounter: Payer: Self-pay | Admitting: Nurse Practitioner

## 2017-05-22 VITALS — BP 138/86 | Temp 98.4°F | Ht 60.0 in | Wt 177.4 lb

## 2017-05-22 DIAGNOSIS — N390 Urinary tract infection, site not specified: Secondary | ICD-10-CM | POA: Diagnosis not present

## 2017-05-22 DIAGNOSIS — R300 Dysuria: Secondary | ICD-10-CM | POA: Diagnosis not present

## 2017-05-22 LAB — POCT UA - MICROSCOPIC ONLY: RBC, URINE, MICROSCOPIC: NEGATIVE

## 2017-05-22 LAB — POCT URINALYSIS DIPSTICK
Nitrite, UA: POSITIVE
Spec Grav, UA: >=1.03 — AB (ref 1.010–1.025)
pH, UA: 5 (ref 5.0–8.0)

## 2017-05-22 MED ORDER — ESTRADIOL 0.1 MG/GM VA CREA: TOPICAL_CREAM | VAGINAL | 0 refills | Status: DC

## 2017-05-22 MED ORDER — CIPROFLOXACIN HCL 500 MG PO TABS: 500.0000 mg | ORAL_TABLET | Freq: Two times a day (BID) | ORAL | 0 refills | Status: DC

## 2017-05-22 NOTE — Progress Notes (Signed)
Subjective:  Presents for c/o urinary frequency, urgency and discomfort at the end of urination. Strong odor. Began 5 days ago. No fever. No back or flank pain. No N/V. No recent UTI. No vaginal discharge. Same sexual partner. Experiencing some external irritation related to menopause.   Objective:   BP 138/86   Temp 98.4 F (36.9 C) (Oral)   Ht 5' (1.524 m)   Wt 177 lb 6.4 oz (80.5 kg)   BMI 34.65 kg/m  NAD. Alert, oriented. Lungs clear. Heart RRR. No CVA tenderness. Abdomen soft, non tender.  Results for orders placed or performed in visit on 05/22/17  POCT Urinalysis Dipstick  Result Value Ref Range   Color, UA     Clarity, UA     Glucose, UA     Bilirubin, UA     Ketones, UA     Spec Grav, UA >=1.030 (A) 1.010 - 1.025   Blood, UA     pH, UA 5.0 5.0 - 8.0   Protein, UA     Urobilinogen, UA  0.2 or 1.0 E.U./dL   Nitrite, UA pos    Leukocytes, UA Small (1+) (A) Negative   Appearance     Odor    POCT UA - Microscopic Only  Result Value Ref Range   WBC, Ur, HPF, POC TNTC    RBC, urine, microscopic neg    Bacteria, U Microscopic many    Mucus, UA     Epithelial cells, urine per micros     Crystals, Ur, HPF, POC     Casts, Ur, LPF, POC     Yeast, UA     Foul odor noted.   Assessment:  Urinary tract infection without hematuria, site unspecified - Plan: Urine Culture, POCT UA - Microscopic Only, CANCELED: POCT Urinalysis Dipstick  Dysuria - Plan: POCT Urinalysis Dipstick, Urine Culture, POCT UA - Microscopic Only    Plan:   Meds ordered this encounter  Medications  . ciprofloxacin (CIPRO) 500 MG tablet    Sig: Take 1 tablet (500 mg total) by mouth 2 (two) times daily.    Dispense:  14 tablet    Refill:  0    Order Specific Question:   Supervising Provider    Answer:   Mikey Kirschner [[id number]]  . estradiol (ESTRACE) 0.1 MG/GM vaginal cream    Sig: Use 1/4 applicator intravag twice a week as needed    Dispense:  42.5 g    Refill:  0    Order Specific Question:    Supervising Provider    Answer:   Mikey Kirschner [[id number]]   AZO as directed for 48 hours then DC. Urine culture pending.  Call back in 72 hours if no improvement, sooner if worse. Warning signs reviewed. Apply small amount of estrogen cream as directed prn.

## 2017-05-24 LAB — URINE CULTURE

## 2017-06-08 ENCOUNTER — Encounter: Payer: Self-pay | Admitting: Nurse Practitioner

## 2017-06-09 ENCOUNTER — Other Ambulatory Visit: Payer: Self-pay | Admitting: Nurse Practitioner

## 2017-06-09 MED ORDER — ZOSTER VAC RECOMB ADJUVANTED 50 MCG/0.5ML IM SUSR: 0.5000 mL | Freq: Once | INTRAMUSCULAR | 1 refills | Status: AC

## 2017-06-09 MED ORDER — CIPROFLOXACIN HCL 500 MG PO TABS: 500.0000 mg | ORAL_TABLET | Freq: Two times a day (BID) | ORAL | 0 refills | Status: DC

## 2017-06-09 MED ORDER — SULFAMETHOXAZOLE-TRIMETHOPRIM 800-160 MG PO TABS: ORAL_TABLET | ORAL | 0 refills | Status: DC

## 2017-07-03 ENCOUNTER — Other Ambulatory Visit: Payer: Self-pay | Admitting: Family Medicine

## 2017-07-03 ENCOUNTER — Other Ambulatory Visit: Payer: Self-pay | Admitting: Nurse Practitioner

## 2017-09-30 ENCOUNTER — Other Ambulatory Visit: Payer: Self-pay | Admitting: Nurse Practitioner

## 2017-10-24 ENCOUNTER — Telehealth: Payer: Self-pay | Admitting: Family Medicine

## 2017-10-24 ENCOUNTER — Other Ambulatory Visit: Payer: Self-pay | Admitting: *Deleted

## 2017-10-24 MED ORDER — CLONAZEPAM 0.5 MG PO TABS: ORAL_TABLET | ORAL | 0 refills | Status: DC

## 2017-10-24 NOTE — Telephone Encounter (Signed)
Ok times one 

## 2017-10-24 NOTE — Telephone Encounter (Signed)
Patient is requesting refill on clonazePAM (KLONOPIN) 0.5 MG tablet.  She is leaving to go out of town this weekend.  [place]

## 2017-10-24 NOTE — Telephone Encounter (Signed)
Refill called in and pt notified.

## 2017-11-10 ENCOUNTER — Other Ambulatory Visit: Payer: Self-pay | Admitting: Nurse Practitioner

## 2017-11-15 ENCOUNTER — Other Ambulatory Visit: Payer: Self-pay | Admitting: Family Medicine

## 2017-11-15 ENCOUNTER — Telehealth: Payer: Self-pay | Admitting: Family Medicine

## 2017-11-15 MED ORDER — PRISTIQ 100 MG PO TB24: ORAL_TABLET | ORAL | 0 refills | Status: DC

## 2017-11-15 NOTE — Telephone Encounter (Signed)
Patient is out of her generic PRISTIQ 100 MG 24 hr tablet.  She is requesting a refill 90 day supply.  [place]

## 2017-11-15 NOTE — Telephone Encounter (Signed)
See phone message

## 2017-11-15 NOTE — Telephone Encounter (Signed)
Addressed by other means today 11/15/2017.

## 2017-11-15 NOTE — Telephone Encounter (Signed)
Medication sent to [facility]. I called in one month supply. I called the pt and left a message asking if she would return call as I need to inform of an appt for further refills.

## 2017-11-15 NOTE — Telephone Encounter (Signed)
Ok but needs a six mo visit f u

## 2017-11-16 NOTE — Telephone Encounter (Signed)
I called and left a message asked that she r/c. 

## 2017-11-21 NOTE — Telephone Encounter (Signed)
Patient notified and stated she has an office visit scheduled for 11/22/17 with Dr Richardson Landry.

## 2017-11-22 ENCOUNTER — Encounter: Payer: Self-pay | Admitting: Family Medicine

## 2017-11-22 ENCOUNTER — Ambulatory Visit: Payer: Medicare Other | Admitting: Family Medicine

## 2017-11-22 VITALS — BP 136/86 | Ht 60.0 in | Wt 184.6 lb

## 2017-11-22 DIAGNOSIS — R5383 Other fatigue: Secondary | ICD-10-CM

## 2017-11-22 DIAGNOSIS — F3289 Other specified depressive episodes: Secondary | ICD-10-CM

## 2017-11-22 DIAGNOSIS — Z1329 Encounter for screening for other suspected endocrine disorder: Secondary | ICD-10-CM

## 2017-11-22 DIAGNOSIS — Z780 Asymptomatic menopausal state: Secondary | ICD-10-CM

## 2017-11-22 DIAGNOSIS — M810 Age-related osteoporosis without current pathological fracture: Secondary | ICD-10-CM

## 2017-11-22 DIAGNOSIS — E785 Hyperlipidemia, unspecified: Secondary | ICD-10-CM

## 2017-11-22 DIAGNOSIS — F5101 Primary insomnia: Secondary | ICD-10-CM

## 2017-11-22 DIAGNOSIS — K219 Gastro-esophageal reflux disease without esophagitis: Secondary | ICD-10-CM | POA: Diagnosis not present

## 2017-11-22 DIAGNOSIS — G35 Multiple sclerosis: Secondary | ICD-10-CM | POA: Diagnosis not present

## 2017-11-22 DIAGNOSIS — Z79899 Other long term (current) drug therapy: Secondary | ICD-10-CM

## 2017-11-22 MED ORDER — PRISTIQ 100 MG PO TB24: ORAL_TABLET | ORAL | 1 refills | Status: DC

## 2017-11-22 MED ORDER — CLONAZEPAM 0.5 MG PO TABS: ORAL_TABLET | ORAL | 5 refills | Status: DC

## 2017-11-22 MED ORDER — ROSUVASTATIN CALCIUM 10 MG PO TABS: ORAL_TABLET | ORAL | 1 refills | Status: DC

## 2017-11-22 MED ORDER — DOXEPIN HCL 50 MG PO CAPS: 50.0000 mg | ORAL_CAPSULE | Freq: Every day | ORAL | 1 refills | Status: DC

## 2017-11-22 NOTE — Progress Notes (Signed)
   Subjective:    Patient ID: Jasmine Patton, female    DOB: 1950-05-10, 67 y.o.   MRN: [id number]  Hyperlipidemia  This is a chronic problem. The current episode started more than 1 year ago. Treatments tried: crestor. There are no compliance problems.    Patient would like 90 day supply of medications.  Patient continues to take lipid medication regularly. No obvious side effects from it. Generally does not miss a dose. Prior blood work results are reviewed with patient. Patient continues to work on fat intake in diet  Heat is difficult for the pt, prone to overheating     Patient notes ongoing compliance with antidepressant medication. No obvious side effects. Reports does not miss a dose. Overall continues to help depression substantially. No thoughts of homicide or suicide. Would like to maintain medication.   Pt on cbd oil supp and notes a bit moe energy   Has taken some falls , developed a lump     MS last mri revealed one lesion had enlarged, another had shrunk, so for now on yrly  visits    Trying to work down on the klonopin   Notes some parathesias at times but no sig relapse  Hemp oil had helped the paraesthei a  s; Review of Systems No headache, no major weight loss or weight gain, no chest pain no back pain abdominal pain no change in bowel habits complete ROS otherwise negative     Objective:   Physical Exam   Alert and oriented, vitals reviewed and stable, NAD ENT-TM's and ext canals WNL bilat via otoscopic exam Soft palate, tonsils and post pharynx WNL via oropharyngeal exam Neck-symmetric, no masses; thyroid nonpalpable and nontender Pulmonary-no tachypnea or accessory muscle use; Clear without wheezes via auscultation Card--no abnrml murmurs, rhythm reg and rate WNL Carotid pulses symmetric, without bruits      Assessment & Plan:  Impression #1 hyperlipidemia.  What work reviewed.  Compliance discussed.  Discussed maintain same meds  2.  MS.   Ongoing challenges.  Continues to remain active.  No flares.  3.  Depression clinically stable.  Compliant with meds no obvious side effects  4 fatigue.  Discussed.  Likely multifactorial  Appropriate blood work.  Medications refilled diet exercise discussed  Greater than 50% of this 25 minute face to face visit was spent in counseling and discussion and coordination of care regarding the above diagnosis/diagnosies  Bone density test

## 2017-12-06 ENCOUNTER — Other Ambulatory Visit (HOSPITAL_COMMUNITY): Payer: Medicare Other

## 2018-02-01 ENCOUNTER — Encounter: Payer: Self-pay | Admitting: Family Medicine

## 2018-02-01 ENCOUNTER — Other Ambulatory Visit: Payer: Self-pay | Admitting: Family Medicine

## 2018-02-01 DIAGNOSIS — K219 Gastro-esophageal reflux disease without esophagitis: Secondary | ICD-10-CM

## 2018-02-02 ENCOUNTER — Other Ambulatory Visit: Payer: Self-pay

## 2018-02-02 DIAGNOSIS — K219 Gastro-esophageal reflux disease without esophagitis: Secondary | ICD-10-CM

## 2018-02-02 MED ORDER — PANTOPRAZOLE SODIUM 40 MG PO TBEC: DELAYED_RELEASE_TABLET | ORAL | 3 refills | Status: DC

## 2018-04-13 ENCOUNTER — Ambulatory Visit (INDEPENDENT_AMBULATORY_CARE_PROVIDER_SITE_OTHER): Payer: Medicare Other | Admitting: *Deleted

## 2018-04-13 DIAGNOSIS — Z23 Encounter for immunization: Secondary | ICD-10-CM | POA: Diagnosis not present

## 2018-04-25 ENCOUNTER — Ambulatory Visit (HOSPITAL_COMMUNITY): Payer: Medicare Other

## 2018-04-25 ENCOUNTER — Encounter: Payer: Self-pay | Admitting: Family Medicine

## 2018-04-25 ENCOUNTER — Telehealth: Payer: Self-pay | Admitting: Family Medicine

## 2018-04-25 ENCOUNTER — Ambulatory Visit (INDEPENDENT_AMBULATORY_CARE_PROVIDER_SITE_OTHER): Payer: Medicare Other | Admitting: Family Medicine

## 2018-04-25 VITALS — BP 134/84 | Ht 60.0 in | Wt 181.4 lb

## 2018-04-25 DIAGNOSIS — Z1329 Encounter for screening for other suspected endocrine disorder: Secondary | ICD-10-CM

## 2018-04-25 DIAGNOSIS — Z780 Asymptomatic menopausal state: Secondary | ICD-10-CM | POA: Diagnosis not present

## 2018-04-25 DIAGNOSIS — Z23 Encounter for immunization: Secondary | ICD-10-CM

## 2018-04-25 DIAGNOSIS — Z0000 Encounter for general adult medical examination without abnormal findings: Secondary | ICD-10-CM

## 2018-04-25 DIAGNOSIS — F3289 Other specified depressive episodes: Secondary | ICD-10-CM

## 2018-04-25 DIAGNOSIS — Z79899 Other long term (current) drug therapy: Secondary | ICD-10-CM

## 2018-04-25 DIAGNOSIS — Z1159 Encounter for screening for other viral diseases: Secondary | ICD-10-CM | POA: Diagnosis not present

## 2018-04-25 DIAGNOSIS — E785 Hyperlipidemia, unspecified: Secondary | ICD-10-CM | POA: Diagnosis not present

## 2018-04-25 MED ORDER — ROSUVASTATIN CALCIUM 10 MG PO TABS: ORAL_TABLET | ORAL | 1 refills | Status: DC

## 2018-04-25 MED ORDER — PRISTIQ 100 MG PO TB24: ORAL_TABLET | ORAL | 1 refills | Status: DC

## 2018-04-25 MED ORDER — DOXEPIN HCL 50 MG PO CAPS: 50.0000 mg | ORAL_CAPSULE | Freq: Every day | ORAL | 1 refills | Status: DC

## 2018-04-25 NOTE — Telephone Encounter (Signed)
Jasmine Patton from [facility] contacted office to see if it was ok to give generic of Pristiq to patient. Pt has been getting generic brand of this med. Please advise. Thank you.

## 2018-04-25 NOTE — Progress Notes (Signed)
Subjective:    Patient ID: Jasmine Patton, female    DOB: 29-Jul-1950, 68 y.o.   MRN: [id number]  HPI  The patient comes in today for a wellness visit.  A review of their health history was completed.  A review of medications was also completed.  Any needed refills; no  Eating habits: trying to eat healthy  Falls/  MVA accidents in past few months: none- stumbles wit MS but no falls  Regular exercise: not recently, [facility]   Specialist pt sees on regular basis: Dr Charlena Cross for MS  Preventative health issues were discussed.   Additional concerns: patient has noticed more arthritis pain in joints and takes advil  Depression: Doing well on current medications, has tried to spend more time outdoors which helps. Compliant with meds, would like to continue. Denies SI/HI. Usually take clonazepam 1 tablet at night for sleep, takes about 5 days a week or so.   Cholesterol: compliant with med, no problems.   Needs DEXA scan  Had 1st shingles vaccine sometime last year, pt will check with pharmacy to see if she is due for second shot.   Review of Systems  Constitutional: Negative for chills, fatigue, fever and unexpected weight change.  HENT: Negative for congestion, ear pain, sinus pressure, sinus pain and sore throat.   Eyes: Negative for discharge and visual disturbance.  Respiratory: Negative for cough, shortness of breath and wheezing.   Cardiovascular: Negative for chest pain and leg swelling.  Gastrointestinal: Negative for abdominal pain, blood in stool, constipation, diarrhea, nausea and vomiting.  Genitourinary: Negative for difficulty urinating, hematuria, vaginal bleeding and vaginal discharge.  Neurological: Negative for dizziness, weakness, light-headedness and headaches.  Psychiatric/Behavioral: Negative for dysphoric mood and suicidal ideas.  All other systems reviewed and are negative.      Objective:   Physical Exam Vitals signs and nursing note reviewed. Exam conducted with a chaperone present.  Constitutional:      General: She is not in acute distress.    Appearance: She is well-developed.  HENT:     Head: Normocephalic and atraumatic.     Right Ear: Tympanic membrane normal.     Left Ear: Tympanic membrane normal.     Nose: Nose normal.     Mouth/Throat:     Mouth: Mucous membranes are moist.     Pharynx: Oropharynx is clear. Uvula midline.  Eyes:     General:        Right eye: No discharge.        Left eye: No discharge.     Conjunctiva/sclera: Conjunctivae normal.     Pupils: Pupils are equal, round, and reactive to light.  Neck:     Musculoskeletal: Neck supple.     Thyroid: No thyromegaly.  Cardiovascular:     Rate and Rhythm: Normal rate and regular rhythm.     Heart sounds: Normal heart sounds. No murmur.  Pulmonary:     Effort: Pulmonary effort is normal. No respiratory distress.     Breath sounds: Normal breath sounds. No wheezing.  Chest:     Breasts: Breasts are symmetrical.        Right: Normal.        Left: Normal.  Abdominal:     General: Bowel sounds are normal. There is no distension.     Palpations: Abdomen is soft. There is no mass.     Tenderness: There is no abdominal tenderness.  Genitourinary:  Labia:        Right: No rash, tenderness or lesion.        Left: No rash, tenderness or lesion.      Vagina: Normal.     Cervix: Normal.     Uterus: Normal.      Adnexa: Right adnexa normal and left adnexa normal.     Comments: Bimanual exam without tenderness or obvious masses, exam limited d/t abdominal girth Musculoskeletal:        General: No deformity.  Lymphadenopathy:     Cervical: No cervical adenopathy.     Upper Body:     Right upper body: No supraclavicular or axillary adenopathy.     Left upper body: No supraclavicular or axillary adenopathy.  Skin:    General: Skin is warm and dry.  Neurological:     Mental Status: She is alert  and oriented to person, place, and time.     Coordination: Coordination normal.    Depression screen [facility] 2/9 04/25/2018 11/22/2017 12/28/2016  Decreased Interest 0 1 1  Down, Depressed, Hopeless 0 0 1  PHQ - 2 Score 0 1 2  Altered sleeping - 1 1  Tired, decreased energy - 2 1  Change in appetite - 1 1  Feeling bad or failure about yourself  - 0 1  Trouble concentrating - 1 1  Moving slowly or fidgety/restless - 1 0  Suicidal thoughts - 0 0  PHQ-9 Score - 7 7  Difficult doing work/chores - Somewhat difficult Very difficult          Assessment & Plan:  1. Annual physical exam Adult wellness-complete.wellness physical was conducted today. Importance of diet and exercise were discussed in detail.  In addition to this a discussion regarding safety was also covered. We also reviewed over immunizations and gave recommendations regarding current immunization needed for age.   -Prevnar today In addition to this additional areas were also touched on including: Preventative health exams needed:  Colonoscopy UTD Mammogram due, pt to schedule Pap Smear N/A  Patient was advised yearly wellness exam. Screening labs ordered, will notify of results.  2. Hyperlipidemia, unspecified hyperlipidemia type - Plan: Lipid panel Will continue current medication regimen. Will check lipid panel. F/u 6 months. Encouraged healthy diet and exercise.   3. Postmenopausal - Plan: DG Bone Density Will check bone density with DEXA scan.   4. Other depression Doing well on current medications. Pt would like to continue. Refills given. F/u 6 months.

## 2018-04-26 NOTE — Telephone Encounter (Signed)
Contacted Tammy at [facility] and let them know that generic of Pristiq is fine. Tammy at [facility] verbalized understanding.

## 2018-04-26 NOTE — Telephone Encounter (Signed)
If she's been getting generic then that's fine. Thanks.

## 2018-04-30 ENCOUNTER — Ambulatory Visit (HOSPITAL_COMMUNITY): Payer: Medicare Other

## 2018-05-25 ENCOUNTER — Ambulatory Visit: Payer: Medicare Other | Admitting: Family Medicine

## 2018-06-19 ENCOUNTER — Other Ambulatory Visit: Payer: Self-pay | Admitting: Family Medicine

## 2018-06-21 ENCOUNTER — Encounter: Payer: Self-pay | Admitting: Family Medicine

## 2018-06-22 ENCOUNTER — Other Ambulatory Visit: Payer: Self-pay | Admitting: Family Medicine

## 2018-06-22 MED ORDER — CLONAZEPAM 0.5 MG PO TABS: ORAL_TABLET | ORAL | 5 refills | Status: DC

## 2018-06-22 NOTE — Telephone Encounter (Signed)
Refill sent in for her clonazepam to pharmacy listed. Please discard of any pended orders.   I do think it is wise to stay at home as much as possible during this time. She should get lab work and other screening tests done in a few months hopefully when things have calmed down some.   Thanks!

## 2018-06-29 ENCOUNTER — Telehealth: Payer: Medicare Other | Admitting: Family

## 2018-06-29 ENCOUNTER — Encounter: Payer: Self-pay | Admitting: Family Medicine

## 2018-06-29 DIAGNOSIS — L237 Allergic contact dermatitis due to plants, except food: Secondary | ICD-10-CM | POA: Diagnosis not present

## 2018-06-29 MED ORDER — PREDNISONE 10 MG (21) PO TBPK: ORAL_TABLET | ORAL | 0 refills | Status: DC

## 2018-06-29 NOTE — Progress Notes (Signed)

## 2018-07-02 MED ORDER — TRIAMCINOLONE ACETONIDE 0.1 % EX CREA: 1.0000 "application " | TOPICAL_CREAM | Freq: Two times a day (BID) | CUTANEOUS | 0 refills | Status: DC

## 2018-07-02 MED ORDER — PREDNISONE 20 MG PO TABS: ORAL_TABLET | ORAL | 0 refills | Status: DC

## 2018-07-02 NOTE — Telephone Encounter (Signed)
E visit- not Elvis per patient

## 2018-07-02 NOTE — Addendum Note (Signed)
Addended by: Dairl Ponder on: 07/02/2018 01:17 PM   Modules accepted: Orders

## 2018-07-02 NOTE — Telephone Encounter (Signed)
Prescriptions sent electronically to pharmacy. Patient notified and given information concerning prednisone usage and illness.

## 2018-12-03 ENCOUNTER — Encounter: Payer: Self-pay | Admitting: Family Medicine

## 2018-12-12 ENCOUNTER — Other Ambulatory Visit: Payer: Self-pay

## 2018-12-12 DIAGNOSIS — Z20822 Contact with and (suspected) exposure to covid-19: Secondary | ICD-10-CM

## 2018-12-13 LAB — NOVEL CORONAVIRUS, NAA: SARS-CoV-2, NAA: NOT DETECTED

## 2019-01-08 ENCOUNTER — Other Ambulatory Visit: Payer: Self-pay

## 2019-01-08 ENCOUNTER — Ambulatory Visit (INDEPENDENT_AMBULATORY_CARE_PROVIDER_SITE_OTHER): Payer: Medicare Other | Admitting: Family Medicine

## 2019-01-08 DIAGNOSIS — E785 Hyperlipidemia, unspecified: Secondary | ICD-10-CM

## 2019-01-08 DIAGNOSIS — K219 Gastro-esophageal reflux disease without esophagitis: Secondary | ICD-10-CM | POA: Diagnosis not present

## 2019-01-08 DIAGNOSIS — Z79899 Other long term (current) drug therapy: Secondary | ICD-10-CM

## 2019-01-08 DIAGNOSIS — Z1329 Encounter for screening for other suspected endocrine disorder: Secondary | ICD-10-CM | POA: Diagnosis not present

## 2019-01-08 DIAGNOSIS — F3289 Other specified depressive episodes: Secondary | ICD-10-CM

## 2019-01-08 DIAGNOSIS — Z1159 Encounter for screening for other viral diseases: Secondary | ICD-10-CM

## 2019-01-08 MED ORDER — PANTOPRAZOLE SODIUM 40 MG PO TBEC: DELAYED_RELEASE_TABLET | ORAL | 1 refills | Status: DC

## 2019-01-08 MED ORDER — PRISTIQ 100 MG PO TB24: ORAL_TABLET | ORAL | 1 refills | Status: DC

## 2019-01-08 MED ORDER — DOXEPIN HCL 50 MG PO CAPS: 50.0000 mg | ORAL_CAPSULE | Freq: Every day | ORAL | 1 refills | Status: DC

## 2019-01-08 MED ORDER — DESVENLAFAXINE SUCCINATE ER 100 MG PO TB24: ORAL_TABLET | ORAL | 1 refills | Status: DC

## 2019-01-08 MED ORDER — ROSUVASTATIN CALCIUM 10 MG PO TABS: ORAL_TABLET | ORAL | 1 refills | Status: DC

## 2019-01-08 MED ORDER — CLONAZEPAM 1 MG PO TABS: ORAL_TABLET | ORAL | 5 refills | Status: DC

## 2019-01-08 NOTE — Progress Notes (Signed)
   Subjective:    Patient ID: Jasmine Patton, female    DOB: 14-Dec-1950, 68 y.o.   MRN: [id number]  HPI Pt here for 6 month follow up. Pt states that she is doing well considering everything going on. Pt states she is having some issues with sleeping. Pt states she has Clonazepam 0.5 mg and pt is wanting to know if she needs to increase the dose. Pt states she can go to sleep but wakes up sometimes and can not go back to sleep.   Virtual Visit via Video Note  I connected with Jasmine Patton on 01/08/19 at  8:40 AM EDT by a video enabled telemedicine application and verified that I am speaking with the correct person using two identifiers.  Location: Patient: home Provider: office   I discussed the limitations of evaluation and management by telemedicine and the availability of in person appointments. The patient expressed understanding and agreed to proceed.  History of Present Illness:    Observations/Objective:   Assessment and Plan:   Follow Up Instructions:    I discussed the assessment and treatment plan with the patient. The patient was provided an opportunity to ask questions and all were answered. The patient agreed with the plan and demonstrated an understanding of the instructions.   The patient was advised to call back or seek an in-person evaluation if the symptoms worsen or if the condition fails to improve as anticipated.  I provided 25 minutes of non-face-to-face time during this encounter.   Vicente Males, LPN  Patient notes ongoing compliance with antidepressant medication. No obvious side effects. Reports does not miss a dose. Overall continues to help depression substantially. No thoughts of homicide or suicide. Would like to maintain medication.  Patient compliant with insomnia medication. Generally takes most nights. No obvious morning drowsiness. Definitely helps patient sleep. Without it patient states would not get a good nights rest.  Patient  continues to take lipid medication regularly. No obvious side effects from it. Generally does not miss a dose. Prior blood work results are reviewed with patient. Patient continues to work on fat intake in diet  Patient has MS.  Fairly stable of late.  Currently seen yearly by specialist.  Trying really hard to exercise regularly.  Reflux ongoing with definite need for medications  Review of Systems No headache, no major weight loss or weight gain, no chest pain no back pain abdominal pain no change in bowel habits complete ROS otherwise negative     Objective:   Physical Exam Alert and oriented, vitals reviewed and stable, NAD ENT-TM's and ext canals WNL bilat via otoscopic exam Soft palate, tonsils and post pharynx WNL via oropharyngeal exam Neck-symmetric, no masses; thyroid nonpalpable and nontender Pulmonary-no tachypnea or accessory muscle use; Clear without wheezes via auscultation Card--no abnrml murmurs, rhythm reg and rate WNL Carotid pulses symmetric, without bruits        Assessment & Plan:  Impression hyperlipidemia.  Status uncertain blood work ordered medications refilled compliance discussed and diet discussed  2.  Reflux with ongoing need for meds meds refilled  3.  MS followed by specialist discussed briefly  4.  Depression clinically stable compliant meds  5.  Insomnia clinically stable compliant meds  Follow-up in 6 months medications refilled diet exercise discussed flu shot encouraged

## 2019-01-13 ENCOUNTER — Encounter: Payer: Self-pay | Admitting: Family Medicine

## 2019-01-22 ENCOUNTER — Other Ambulatory Visit: Payer: Self-pay

## 2019-01-22 ENCOUNTER — Other Ambulatory Visit (INDEPENDENT_AMBULATORY_CARE_PROVIDER_SITE_OTHER): Payer: Medicare Other | Admitting: *Deleted

## 2019-01-22 DIAGNOSIS — Z23 Encounter for immunization: Secondary | ICD-10-CM | POA: Diagnosis not present

## 2019-04-26 DIAGNOSIS — Z20828 Contact with and (suspected) exposure to other viral communicable diseases: Secondary | ICD-10-CM | POA: Diagnosis not present

## 2019-05-03 ENCOUNTER — Ambulatory Visit: Payer: Medicare Other

## 2019-05-09 ENCOUNTER — Encounter: Payer: Self-pay | Admitting: Family Medicine

## 2019-05-11 ENCOUNTER — Ambulatory Visit: Payer: Medicare Other

## 2019-05-13 ENCOUNTER — Ambulatory Visit: Payer: Medicare PPO | Attending: Internal Medicine

## 2019-05-13 DIAGNOSIS — Z23 Encounter for immunization: Secondary | ICD-10-CM | POA: Insufficient documentation

## 2019-05-13 NOTE — Progress Notes (Signed)
   Covid-19 Vaccination Clinic  Name:  Jasmine Patton    MRN: [id number] DOB: 12/01/1950  05/13/2019  [name] was observed post Covid-19 immunization for 15 minutes without incidence. She was provided with Vaccine Information Sheet and instruction to access the V-Safe system.   [name] was instructed to call 911 with any severe reactions post vaccine: Marland Kitchen Difficulty breathing  . Swelling of your face and throat  . A fast heartbeat  . A bad rash all over your body  . Dizziness and weakness    Immunizations Administered    Name Date Dose VIS Date Route   Pfizer COVID-19 Vaccine 05/13/2019  3:05 PM 0.3 mL 03/15/2019 Intramuscular   Manufacturer: [place]   Lot: [id number]   [place]: [id number]

## 2019-05-20 ENCOUNTER — Ambulatory Visit: Payer: Medicare Other

## 2019-06-06 ENCOUNTER — Other Ambulatory Visit: Payer: Self-pay

## 2019-06-06 ENCOUNTER — Ambulatory Visit (INDEPENDENT_AMBULATORY_CARE_PROVIDER_SITE_OTHER): Payer: Medicare PPO | Admitting: Family Medicine

## 2019-06-06 DIAGNOSIS — K219 Gastro-esophageal reflux disease without esophagitis: Secondary | ICD-10-CM

## 2019-06-06 MED ORDER — DESVENLAFAXINE SUCCINATE ER 100 MG PO TB24: ORAL_TABLET | ORAL | 1 refills | Status: DC

## 2019-06-06 MED ORDER — ROSUVASTATIN CALCIUM 10 MG PO TABS: ORAL_TABLET | ORAL | 1 refills | Status: DC

## 2019-06-06 MED ORDER — PANTOPRAZOLE SODIUM 40 MG PO TBEC: DELAYED_RELEASE_TABLET | ORAL | 1 refills | Status: DC

## 2019-06-06 MED ORDER — DOXEPIN HCL 50 MG PO CAPS: 50.0000 mg | ORAL_CAPSULE | Freq: Every day | ORAL | 1 refills | Status: DC

## 2019-06-06 MED ORDER — DIPHENOXYLATE-ATROPINE 2.5-0.025 MG PO TABS: ORAL_TABLET | ORAL | 3 refills | Status: DC

## 2019-06-06 MED ORDER — CLONAZEPAM 1 MG PO TABS: ORAL_TABLET | ORAL | 5 refills | Status: DC

## 2019-06-06 NOTE — Progress Notes (Signed)
   Subjective:  Audio video  Patient ID: Jasmine Patton, female    DOB: 08-22-1950, 69 y.o.   MRN: [id number]  HPI Pt states she is having GI issues. Pt is having severe constipation and at times diarrhea. Pt states this has been going on for months but has become more frequent. Pt states she has started a new probiotic but other than that no new med; has not changed diet much. Pt states she is also having some belching and discomfort. Pt takes Pantoprazole daily. Pt wanting to know if the belching and discomfort could possibly be from gallbladder issues? (Pt has family members that have gallbladder issues)  Virtual Visit via Telephone Note  I connected with Jasmine Patton on 06/06/19 at  1:10 PM EST by telephone and verified that I am speaking with the correct person using two identifiers.  Location: Patient: home Provider: office    I discussed the limitations, risks, security and privacy concerns of performing an evaluation and management service by telephone and the availability of in person appointments. I also discussed with the patient that there may be a patient responsible charge related to this service. The patient expressed understanding and agreed to proceed.   History of Present Illness:    Observations/Objective:   Assessment and Plan:   Follow Up Instructions:    I discussed the assessment and treatment plan with the patient. The patient was provided an opportunity to ask questions and all were answered. The patient agreed with the plan and demonstrated an understanding of the instructions.   The patient was advised to call back or seek an in-person evaluation if the symptoms worsen or if the condition fails to improve as anticipated.  I provided 30 minutes of non-face-to-face time during this encounter.  Upper chest  Feels pressure sensation  Loud belches  Worse in rhe eve  Spicy food worsen  fam hx of g b disease  Pt trying not to take every nigt  with the protonix  Four out of seven   Food getting stuck sensation, but has not completely obstructed at any point yet.   Sometimes strangling sens     Review of Systems No headache no chest pain no shortness of breath    Objective:   Physical Exam  Virtual      Assessment & Plan:  Impression flare of chronic reflux esophagitis.  No evidence per history of gallbladder disease despite patient's concerns.  Strongly encouraged the use of proton pump inhibitor regularly.  Rationale discussed.  General concerns discussed.  Numerous questions answered.  If persists despite moving to daily use of proton pump inhibitor would consider GI referral

## 2019-06-07 ENCOUNTER — Ambulatory Visit: Payer: Medicare PPO | Attending: Internal Medicine

## 2019-06-07 DIAGNOSIS — Z23 Encounter for immunization: Secondary | ICD-10-CM | POA: Insufficient documentation

## 2019-06-07 NOTE — Progress Notes (Signed)
   Covid-19 Vaccination Clinic  Name:  Jasmine Patton    MRN: [id number] DOB: 1950/11/25  06/07/2019  [name] was observed post Covid-19 immunization for 15 minutes without incident. She was provided with Vaccine Information Sheet and instruction to access the V-Safe system.   [name] was instructed to call 911 with any severe reactions post vaccine: Marland Kitchen Difficulty breathing  . Swelling of face and throat  . A fast heartbeat  . A bad rash all over body  . Dizziness and weakness   Immunizations Administered    Name Date Dose VIS Date Route   Pfizer COVID-19 Vaccine 06/07/2019  2:22 PM 0.3 mL 03/15/2019 Intramuscular   Manufacturer: [place]   Lot: [id number]   [place]: [id number]

## 2019-07-12 ENCOUNTER — Telehealth: Payer: Self-pay | Admitting: *Deleted

## 2019-07-12 NOTE — Telephone Encounter (Signed)
May give may give verbal to refill medication 1 day early

## 2019-07-12 NOTE — Telephone Encounter (Signed)
[facility] pharm calling to see if klonopin can be filled today which is one day early because pt is going out of town tomorrow and will be gone for 3 weeks.

## 2019-07-12 NOTE — Telephone Encounter (Signed)
Called pharmacy and let them know it was ok to refill 1 day Jasmine Patton.

## 2019-07-16 ENCOUNTER — Encounter: Payer: Self-pay | Admitting: Family Medicine

## 2019-07-17 ENCOUNTER — Other Ambulatory Visit: Payer: Self-pay

## 2019-07-17 ENCOUNTER — Ambulatory Visit (INDEPENDENT_AMBULATORY_CARE_PROVIDER_SITE_OTHER): Payer: Medicare PPO | Admitting: Family Medicine

## 2019-07-17 DIAGNOSIS — M545 Low back pain: Secondary | ICD-10-CM

## 2019-07-17 DIAGNOSIS — K219 Gastro-esophageal reflux disease without esophagitis: Secondary | ICD-10-CM

## 2019-07-17 DIAGNOSIS — S39012A Strain of muscle, fascia and tendon of lower back, initial encounter: Secondary | ICD-10-CM

## 2019-07-17 DIAGNOSIS — E785 Hyperlipidemia, unspecified: Secondary | ICD-10-CM

## 2019-07-17 DIAGNOSIS — Z79899 Other long term (current) drug therapy: Secondary | ICD-10-CM | POA: Diagnosis not present

## 2019-07-17 DIAGNOSIS — Z1329 Encounter for screening for other suspected endocrine disorder: Secondary | ICD-10-CM | POA: Diagnosis not present

## 2019-07-17 MED ORDER — CLONAZEPAM 1 MG PO TABS: ORAL_TABLET | ORAL | 5 refills | Status: DC

## 2019-07-17 MED ORDER — TIZANIDINE HCL 4 MG PO TABS: ORAL_TABLET | ORAL | 2 refills | Status: DC

## 2019-07-17 MED ORDER — DOXEPIN HCL 50 MG PO CAPS: 50.0000 mg | ORAL_CAPSULE | Freq: Every day | ORAL | 1 refills | Status: DC

## 2019-07-17 MED ORDER — DESVENLAFAXINE SUCCINATE ER 100 MG PO TB24: ORAL_TABLET | ORAL | 1 refills | Status: DC

## 2019-07-17 MED ORDER — ROSUVASTATIN CALCIUM 10 MG PO TABS: ORAL_TABLET | ORAL | 1 refills | Status: DC

## 2019-07-17 MED ORDER — PANTOPRAZOLE SODIUM 40 MG PO TBEC: DELAYED_RELEASE_TABLET | ORAL | 1 refills | Status: DC

## 2019-07-17 NOTE — Progress Notes (Signed)
   Subjective:  Audio only  Patient ID: Jasmine Patton, female    DOB: Feb 12, 1951, 69 y.o.   MRN: [id number]  Back Pain This is a new problem. Episode onset: 3 days. The pain is present in the gluteal (lower back ). Radiates to: gluteal  She has tried NSAIDs for the symptoms. The treatment provided mild relief.   Almost had a fall and believes It may be a muscle spasm. Right side at base of spine radiates to gluteal muscle   Review of Systems  Musculoskeletal: Positive for back pain.   Virtual Visit via Telephone Note  I connected with Kellie Moor on 07/17/19 at 11:00 AM EDT by telephone and verified that I am speaking with the correct person using two identifiers.  Location: Patient: home Provider: office   I discussed the limitations, risks, security and privacy concerns of performing an evaluation and management service by telephone and the availability of in person appointments. I also discussed with the patient that there may be a patient responsible charge related to this service. The patient expressed understanding and agreed to proceed.   History of Present Illness:    Observations/Objective:   Assessment and Plan:   Follow Up Instructions:    I discussed the assessment and treatment plan with the patient. The patient was provided an opportunity to ask questions and all were answered. The patient agreed with the plan and demonstrated an understanding of the instructions.   The patient was advised to call back or seek an in-person evaluation if the symptoms worsen or if the condition fails to improve as anticipated.  I provided 22 minutes of non-face-to-face time during this encounter.         Objective:   Physical Exam  Virtual     Assessment & Plan:  Impression lumbar strain by description.  Patient already on anti-inflammatory medicine.  Patient to add Zanaflex.  Local measures discussed.  Hold off on any x-rays rationale discussed should  gradually improved.  Patient encouraged to get blood work ordered last year will reorder.

## 2019-08-16 DIAGNOSIS — H25013 Cortical age-related cataract, bilateral: Secondary | ICD-10-CM | POA: Diagnosis not present

## 2019-08-16 DIAGNOSIS — H2513 Age-related nuclear cataract, bilateral: Secondary | ICD-10-CM | POA: Diagnosis not present

## 2019-08-22 DIAGNOSIS — Z79899 Other long term (current) drug therapy: Secondary | ICD-10-CM | POA: Diagnosis not present

## 2019-08-22 DIAGNOSIS — K219 Gastro-esophageal reflux disease without esophagitis: Secondary | ICD-10-CM | POA: Diagnosis not present

## 2019-08-22 DIAGNOSIS — E785 Hyperlipidemia, unspecified: Secondary | ICD-10-CM | POA: Diagnosis not present

## 2019-08-22 DIAGNOSIS — L820 Inflamed seborrheic keratosis: Secondary | ICD-10-CM | POA: Diagnosis not present

## 2019-08-22 DIAGNOSIS — Z1329 Encounter for screening for other suspected endocrine disorder: Secondary | ICD-10-CM | POA: Diagnosis not present

## 2019-08-22 DIAGNOSIS — L821 Other seborrheic keratosis: Secondary | ICD-10-CM | POA: Diagnosis not present

## 2019-08-23 LAB — HEPATIC FUNCTION PANEL
ALT: 18 IU/L (ref 0–32)
AST: 19 IU/L (ref 0–40)
Albumin: 4.2 g/dL (ref 3.8–4.8)
Alkaline Phosphatase: 73 IU/L (ref 48–121)
Bilirubin Total: 0.3 mg/dL (ref 0.0–1.2)
Bilirubin, Direct: 0.1 mg/dL (ref 0.00–0.40)
Total Protein: 6.5 g/dL (ref 6.0–8.5)

## 2019-08-23 LAB — CBC WITH DIFFERENTIAL/PLATELET
Basophils Absolute: 0.1 10*3/uL (ref 0.0–0.2)
Basos: 1 %
EOS (ABSOLUTE): 0.2 10*3/uL (ref 0.0–0.4)
Eos: 4 %
Hematocrit: 39.4 % (ref 34.0–46.6)
Hemoglobin: 12.9 g/dL (ref 11.1–15.9)
Immature Grans (Abs): 0 10*3/uL (ref 0.0–0.1)
Immature Granulocytes: 1 %
Lymphocytes Absolute: 2.6 10*3/uL (ref 0.7–3.1)
Lymphs: 42 %
MCH: 31.2 pg (ref 26.6–33.0)
MCHC: 32.7 g/dL (ref 31.5–35.7)
MCV: 95 fL (ref 79–97)
Monocytes Absolute: 0.4 10*3/uL (ref 0.1–0.9)
Monocytes: 6 %
Neutrophils Absolute: 2.9 10*3/uL (ref 1.4–7.0)
Neutrophils: 46 %
Platelets: 312 10*3/uL (ref 150–450)
RBC: 4.14 x10E6/uL (ref 3.77–5.28)
RDW: 12.1 % (ref 11.7–15.4)
WBC: 6.3 10*3/uL (ref 3.4–10.8)

## 2019-08-23 LAB — BASIC METABOLIC PANEL
BUN/Creatinine Ratio: 14 (ref 12–28)
BUN: 12 mg/dL (ref 8–27)
CO2: 27 mmol/L (ref 20–29)
Calcium: 9.3 mg/dL (ref 8.7–10.3)
Chloride: 100 mmol/L (ref 96–106)
Creatinine, Ser: 0.88 mg/dL (ref 0.57–1.00)
GFR calc Af Amer: 78 mL/min/{1.73_m2} (ref >59)
GFR calc non Af Amer: 68 mL/min/{1.73_m2} (ref >59)
Glucose: 111 mg/dL — ABNORMAL HIGH (ref 65–99)
Potassium: 4.4 mmol/L (ref 3.5–5.2)
Sodium: 139 mmol/L (ref 134–144)

## 2019-08-23 LAB — LIPID PANEL
Chol/HDL Ratio: 3.1 ratio (ref 0.0–4.4)
Cholesterol, Total: 151 mg/dL (ref 100–199)
HDL: 49 mg/dL (ref >39)
LDL Chol Calc (NIH): 74 mg/dL (ref 0–99)
Triglycerides: 166 mg/dL — ABNORMAL HIGH (ref 0–149)
VLDL Cholesterol Cal: 28 mg/dL (ref 5–40)

## 2019-08-23 LAB — TSH: TSH: 0.972 u[IU]/mL (ref 0.450–4.500)

## 2019-08-25 ENCOUNTER — Encounter: Payer: Self-pay | Admitting: Family Medicine

## 2019-08-26 ENCOUNTER — Encounter: Payer: Self-pay | Admitting: Family Medicine

## 2019-08-26 NOTE — Telephone Encounter (Signed)
Pt contacted and verbalized understanding. Read patient letter that Dr.Steve sent out, pt verbalized understanding.

## 2019-10-04 DIAGNOSIS — H2513 Age-related nuclear cataract, bilateral: Secondary | ICD-10-CM | POA: Diagnosis not present

## 2019-10-04 DIAGNOSIS — H25013 Cortical age-related cataract, bilateral: Secondary | ICD-10-CM | POA: Diagnosis not present

## 2019-11-14 ENCOUNTER — Encounter (INDEPENDENT_AMBULATORY_CARE_PROVIDER_SITE_OTHER): Payer: Self-pay | Admitting: *Deleted

## 2019-11-14 ENCOUNTER — Telehealth (INDEPENDENT_AMBULATORY_CARE_PROVIDER_SITE_OTHER): Payer: Self-pay | Admitting: *Deleted

## 2019-11-14 ENCOUNTER — Ambulatory Visit (INDEPENDENT_AMBULATORY_CARE_PROVIDER_SITE_OTHER): Payer: Medicare PPO | Admitting: Gastroenterology

## 2019-11-14 ENCOUNTER — Encounter (INDEPENDENT_AMBULATORY_CARE_PROVIDER_SITE_OTHER): Payer: Self-pay | Admitting: Gastroenterology

## 2019-11-14 ENCOUNTER — Other Ambulatory Visit: Payer: Self-pay

## 2019-11-14 VITALS — BP 143/82 | HR 102 | Temp 98.9°F | Ht 61.0 in | Wt 173.1 lb

## 2019-11-14 DIAGNOSIS — R1310 Dysphagia, unspecified: Secondary | ICD-10-CM

## 2019-11-14 DIAGNOSIS — R194 Change in bowel habit: Secondary | ICD-10-CM

## 2019-11-14 DIAGNOSIS — K219 Gastro-esophageal reflux disease without esophagitis: Secondary | ICD-10-CM | POA: Diagnosis not present

## 2019-11-14 MED ORDER — HYOSCYAMINE SULFATE SL 0.125 MG SL SUBL: 0.1250 mg | SUBLINGUAL_TABLET | Freq: Two times a day (BID) | SUBLINGUAL | 3 refills | Status: DC | PRN

## 2019-11-14 NOTE — Progress Notes (Signed)
Patient profile: Jasmine Patton is a 69 y.o. female seen for evaluation of diarrhea. Last seen 10/2016 for EGD-  History of Present Illness: Jasmine Patton is seen today for change in bowel habits.  She reports developing changes in her bowel habits with a chronic baseline constipation & having a bowel movement every 2-3 days. Now she is describing episodes of 2-3 days between stools then passes a hard stool followed by loose liquid stool throughout the rest the day that be so urgent she has had issues with incontinence.  Currently having incontinence about once a week. Symptoms can occur at night as well.  Symptoms ongoing for about 6 months.  Describes her initial stool as a level 1 on the Bristol stool scale then transitions to #5-6.  Occasional bright red blood with wiping but denies any significant blood in stool.  She does have severe abdominal pain with change in bowel habits.  Initially states she has gained weight but per chart review she has lost weight as below.  She does have MS and reports she is not very active.  She takes Protonix once a day which generally controls GERD well, she does avoid foods such as pizza, peppers.  Also noted that she is sensitive to gluten.  Sometimes having to take Mylanta in the evenings for breakthrough symptoms.  She denies any nausea vomiting.  She does have some pill dysphagia.  Husband accompanies and helps with history.  Non-smoker, no alcohol, denies frequent NSAIDs.   Wt Readings from Last 3 Encounters:  11/14/19 173 lb 1.6 oz (78.5 kg)  04/25/18 181 lb 6.4 oz (82.3 kg)  11/22/17 184 lb 9.6 oz (83.7 kg)     Last Colonoscopy: 10/2016-One small polyp in the cecum. - Internal hemorrhoids.  Last Endoscopy: 10/2016- - Normal proximal esophagus and mid esophagus.                           - LA Grade B reflux esophagitis.                           - Z-line regular, 33 cm from the incisors.                           - 3 cm hiatal hernia.                            - Normal stomach.                           - Normal duodenal bulb and second portion of the duodenum.                           - No specimens collected.  Cecal polyp was a tubular adenoma. Results reviewed with patient. Next colonoscopy in 5 years(mother had CRC in her 79s).   Past Medical History:  Past Medical History:  Diagnosis Date  . ADD (attention deficit disorder)    Adult  . Allergic rhinitis   . Anxiety   . Depression   . GERD (gastroesophageal reflux disease)   . Hyperlipidemia   . Insomnia    Chronic  . MS (multiple sclerosis) ([place])   . Osteopenia   . Positive H. pylori test   .  Reflux     Problem List: Patient Active Problem List   Diagnosis Date Noted  . Hyperlipidemia 12/30/2016  . Gastroesophageal reflux disease without esophagitis 07/13/2016  . Family hx of colon cancer 07/13/2016  . Insomnia 02/09/2014  . Fatigue 02/09/2014  . ANXIETY 05/05/2009  . Depression 05/05/2009  . MULTIPLE SCLEROSIS 05/05/2009  . HEMORRHOIDS, INTERNAL 05/05/2009  . GERD 05/05/2009  . OTHER SPECIFIED DISORDER OF THE ESOPHAGUS 05/05/2009  . CONSTIPATION, CHRONIC 05/05/2009  . HELICOBACTER PYLORI GASTRITIS, HX OF 05/05/2009    Past Surgical History: Past Surgical History:  Procedure Laterality Date  . COLONOSCOPY    . COLONOSCOPY N/A 10/12/2016   Procedure: COLONOSCOPY;  Surgeon: Rogene Houston, MD;  Location: [facility];  Service: Endoscopy;  Laterality: N/A;  . Cyst removed from left breast    . DILATION AND CURETTAGE OF UTERUS    . ESOPHAGOGASTRODUODENOSCOPY    . ESOPHAGOGASTRODUODENOSCOPY N/A 10/12/2016   Procedure: ESOPHAGOGASTRODUODENOSCOPY (EGD);  Surgeon: Rogene Houston, MD;  Location: [facility];  Service: Endoscopy;  Laterality: N/A;  1:45  . POLYPECTOMY  10/12/2016   Procedure: POLYPECTOMY;  Surgeon: Rogene Houston, MD;  Location: [facility];  Service: Endoscopy;;  cecal     Allergies: Allergies  Allergen Reactions  .  Ambien [Zolpidem Tartrate]     Side effects   . Etodolac Rash      Home Medications:  Current Outpatient Medications:  .  Biotin 100 MG/GM POWD, Take by mouth. Three times daily., Disp: , Rfl:  .  Cholecalciferol 4000 units CAPS, Take 4,000 Units by mouth daily., Disp: , Rfl:  .  clonazePAM (KLONOPIN) 1 MG tablet, TAKE ONE TABLET BY MOUTH EVERY NIGHT AT BEDTIME AS NEEDED FOR ANXIETY, Disp: 30 tablet, Rfl: 5 .  cyanocobalamin 100 MCG tablet, Take 100 mcg by mouth daily., Disp: , Rfl:  .  desvenlafaxine (PRISTIQ) 100 MG 24 hr tablet, TAKE ONE (1) TABLET EACH DAY, Disp: 90 tablet, Rfl: 1 .  diphenoxylate-atropine (LOMOTIL) 2.5-0.025 MG tablet, Take one tablet po q 6 hrs prn diarrhea, Disp: 30 tablet, Rfl: 3 .  doxepin (SINEQUAN) 50 MG capsule, Take 1 capsule (50 mg total) by mouth at bedtime., Disp: 90 capsule, Rfl: 1 .  famotidine (PEPCID) 20 MG tablet, Take 1 tablet (20 mg total) by mouth at bedtime., Disp: 30 tablet, Rfl: 5 .  ibuprofen (ADVIL,MOTRIN) 200 MG tablet, Take 400 mg by mouth 2 (two) times daily as needed for moderate pain., Disp: , Rfl:  .  Melatonin 300 MCG TABS, Take 300 mcg by mouth at bedtime., Disp: , Rfl:  .  pantoprazole (PROTONIX) 40 MG tablet, TAKE ONE TABLET BY MOUTH DAILY 30 MINUTES BEFORE BREAKFAST, Disp: 90 tablet, Rfl: 1 .  rosuvastatin (CRESTOR) 10 MG tablet, TAKE ONE (1) TABLET BY MOUTH EVERY DAY FOR CHOLESTEROL, Disp: 90 tablet, Rfl: 1 .  tiZANidine (ZANAFLEX) 4 MG tablet, Take one tablet po prn spasms, Disp: 30 tablet, Rfl: 2 .  estradiol (ESTRACE) 0.1 MG/GM vaginal cream, Use 1/4 applicator intravag twice a week as needed (Patient not taking: Reported on 11/14/2019), Disp: 42.5 g, Rfl: 0 .  Probiotic Product (ACIDOPHILUS PEARLS PO), Take 1 capsule by mouth daily., Disp: , Rfl:  .  triamcinolone cream (KENALOG) 0.1 %, Apply 1 application topically 2 (two) times daily. (Patient not taking: Reported on 11/14/2019), Disp: 30 g, Rfl: 0   Family History: family  history includes Cancer in her mother; Colon cancer in her mother; Hypertension in her  father.    Social History:   reports that she has never smoked. She has never used smokeless tobacco. She reports current alcohol use. She reports that she does not use drugs.   Review of Systems: Constitutional: Denies weight loss/weight gain  Eyes: No changes in vision. ENT: No oral lesions, sore throat.  GI: see HPI.  Heme/Lymph: No easy bruising.  CV: No chest pain.  GU: No hematuria.  Integumentary: No rashes.  Neuro: No headaches.  Psych: No depression/anxiety.  Endocrine: No heat/cold intolerance.  Allergic/Immunologic: No urticaria.  Resp: No cough, SOB.  Musculoskeletal: No joint swelling.    Physical Examination: BP (!) 143/82 (BP Location: Right Arm, Patient Position: Sitting, Cuff Size: Normal)   Pulse (!) 102   Temp 98.9 F (37.2 C) (Oral)   Ht 5\' 1"  (1.549 m)   Wt 173 lb 1.6 oz (78.5 kg)   BMI 32.71 kg/m  Gen: NAD, alert and oriented x 4 HEENT: PEERLA, EOMI, Neck: supple, no JVD Chest: CTA bilaterally, no wheezes, crackles, or other adventitious sounds CV: RRR, no m/g/c/r Abd: soft, NT, ND, +BS in all four quadrants; no HSM, guarding, ridigity, or rebound tenderness Ext: no edema, well perfused with 2+ pulses, Skin: no rash or lesions noted on observed skin Lymph: no noted LAD  Data: May 2021 labs reviewed-CMP normal except glucose 111, CBC normal.  TSH normal.  Assessment/Plan: Ms. Rowton is a 69 y.o. female  There are no diagnoses linked to this encounter.    1.  Change in bowel habits-sounds like she is describing constipation with overflow diarrhea.  She she is using Imodium for the overflow diarrhea component of symptoms.  She feels she gets adequate fluid but has straining and hard pellet like stools.  We will start her on a fiber supplement and give Levsin to use sparingly during the episodes to try to prevent urgency.  We will schedule colonoscopy to exclude  structural. Consider miralax regimen in future.   2.  Family history of colon cancer-routinely due 2023 that schedule early as above  3.  GERD-has some breakthrough symptoms on PPI.  Has history of grade B esophagitis.  Is also having pill dysphagia.  She would like to have endoscopy to look for colonoscopy.  Patient denies CP, SOB, and use of blood thinners. I discussed the risks and benefits of procedure including bleeding, perforation, infection, missed lesions, medication reactions and possible hospitalization or surgery if complications. All questions answered.   I personally performed the service, non-incident to. (WP)  Laurine Blazer, [facility]

## 2019-11-14 NOTE — Telephone Encounter (Signed)
Patient needs Plenvu (copay card) ° °

## 2019-11-14 NOTE — Patient Instructions (Signed)
Take benefiber daily with levsin on an as needed basis for symptoms as discussed

## 2019-11-15 MED ORDER — PLENVU 140 G PO SOLR: 1.0000 | Freq: Once | ORAL | 0 refills | Status: AC

## 2020-01-06 ENCOUNTER — Other Ambulatory Visit: Payer: Self-pay | Admitting: Family Medicine

## 2020-01-07 ENCOUNTER — Other Ambulatory Visit (INDEPENDENT_AMBULATORY_CARE_PROVIDER_SITE_OTHER): Payer: Self-pay | Admitting: *Deleted

## 2020-01-07 ENCOUNTER — Other Ambulatory Visit (HOSPITAL_COMMUNITY)
Admission: RE | Admit: 2020-01-07 | Discharge: 2020-01-07 | Disposition: A | Discharge status: Home / Self Care | Payer: Medicare PPO | Source: Ambulatory Visit | Attending: Internal Medicine | Admitting: Internal Medicine

## 2020-01-07 ENCOUNTER — Other Ambulatory Visit: Payer: Self-pay

## 2020-01-07 DIAGNOSIS — Z01812 Encounter for preprocedural laboratory examination: Secondary | ICD-10-CM | POA: Insufficient documentation

## 2020-01-07 DIAGNOSIS — Z20822 Contact with and (suspected) exposure to covid-19: Secondary | ICD-10-CM | POA: Diagnosis not present

## 2020-01-07 LAB — SARS CORONAVIRUS 2 (TAT 6-24 HRS): SARS Coronavirus 2: NEGATIVE

## 2020-01-07 NOTE — Telephone Encounter (Signed)
lvm to schedule appt.  

## 2020-01-07 NOTE — Telephone Encounter (Signed)
Please contact pt to have her set up appt with Dr.Taylor, then may route back to nurses. Thank you

## 2020-01-08 NOTE — Telephone Encounter (Signed)
lvm to schedule appt.  

## 2020-01-09 ENCOUNTER — Encounter (HOSPITAL_COMMUNITY): Payer: Self-pay | Admitting: Internal Medicine

## 2020-01-09 ENCOUNTER — Ambulatory Visit (HOSPITAL_COMMUNITY)
Admission: RE | Admit: 2020-01-09 | Discharge: 2020-01-09 | Disposition: A | Discharge status: Home / Self Care | Payer: Medicare PPO | Attending: Internal Medicine | Admitting: Internal Medicine

## 2020-01-09 ENCOUNTER — Other Ambulatory Visit: Payer: Self-pay

## 2020-01-09 ENCOUNTER — Encounter (HOSPITAL_COMMUNITY)
Admission: RE | Disposition: A | Discharge status: Home / Self Care | Payer: Self-pay | Source: Home / Self Care | Attending: Internal Medicine

## 2020-01-09 DIAGNOSIS — E785 Hyperlipidemia, unspecified: Secondary | ICD-10-CM | POA: Diagnosis not present

## 2020-01-09 DIAGNOSIS — F419 Anxiety disorder, unspecified: Secondary | ICD-10-CM | POA: Insufficient documentation

## 2020-01-09 DIAGNOSIS — G35 Multiple sclerosis: Secondary | ICD-10-CM | POA: Insufficient documentation

## 2020-01-09 DIAGNOSIS — Q394 Esophageal web: Secondary | ICD-10-CM | POA: Diagnosis not present

## 2020-01-09 DIAGNOSIS — R1314 Dysphagia, pharyngoesophageal phase: Secondary | ICD-10-CM | POA: Insufficient documentation

## 2020-01-09 DIAGNOSIS — K222 Esophageal obstruction: Secondary | ICD-10-CM | POA: Insufficient documentation

## 2020-01-09 DIAGNOSIS — K449 Diaphragmatic hernia without obstruction or gangrene: Secondary | ICD-10-CM | POA: Insufficient documentation

## 2020-01-09 DIAGNOSIS — K2289 Other specified disease of esophagus: Secondary | ICD-10-CM

## 2020-01-09 DIAGNOSIS — Z79899 Other long term (current) drug therapy: Secondary | ICD-10-CM | POA: Diagnosis not present

## 2020-01-09 DIAGNOSIS — K529 Noninfective gastroenteritis and colitis, unspecified: Secondary | ICD-10-CM | POA: Insufficient documentation

## 2020-01-09 DIAGNOSIS — Z86010 Personal history of colonic polyps: Secondary | ICD-10-CM | POA: Diagnosis not present

## 2020-01-09 DIAGNOSIS — R159 Full incontinence of feces: Secondary | ICD-10-CM | POA: Diagnosis not present

## 2020-01-09 DIAGNOSIS — R1310 Dysphagia, unspecified: Secondary | ICD-10-CM

## 2020-01-09 DIAGNOSIS — K644 Residual hemorrhoidal skin tags: Secondary | ICD-10-CM | POA: Insufficient documentation

## 2020-01-09 DIAGNOSIS — R194 Change in bowel habit: Secondary | ICD-10-CM | POA: Insufficient documentation

## 2020-01-09 DIAGNOSIS — F32A Depression, unspecified: Secondary | ICD-10-CM | POA: Diagnosis not present

## 2020-01-09 DIAGNOSIS — K2100 Gastro-esophageal reflux disease with esophagitis, without bleeding: Secondary | ICD-10-CM | POA: Insufficient documentation

## 2020-01-09 DIAGNOSIS — D125 Benign neoplasm of sigmoid colon: Secondary | ICD-10-CM

## 2020-01-09 DIAGNOSIS — G4700 Insomnia, unspecified: Secondary | ICD-10-CM | POA: Insufficient documentation

## 2020-01-09 DIAGNOSIS — D123 Benign neoplasm of transverse colon: Secondary | ICD-10-CM | POA: Diagnosis not present

## 2020-01-09 DIAGNOSIS — Z800 Family history of malignant neoplasm of digestive organs: Secondary | ICD-10-CM | POA: Insufficient documentation

## 2020-01-09 DIAGNOSIS — K219 Gastro-esophageal reflux disease without esophagitis: Secondary | ICD-10-CM | POA: Insufficient documentation

## 2020-01-09 HISTORY — PX: COLONOSCOPY: SHX5424

## 2020-01-09 HISTORY — PX: POLYPECTOMY: SHX5525

## 2020-01-09 HISTORY — PX: ESOPHAGOGASTRODUODENOSCOPY: SHX5428

## 2020-01-09 HISTORY — PX: BIOPSY: SHX5522

## 2020-01-09 HISTORY — PX: ESOPHAGEAL DILATION: SHX303

## 2020-01-09 LAB — HM COLONOSCOPY

## 2020-01-09 SURGERY — COLONOSCOPY
Anesthesia: Moderate Sedation

## 2020-01-09 MED ORDER — MIDAZOLAM HCL 5 MG/5ML IJ SOLN
INTRAMUSCULAR | Status: AC
  Filled 2020-01-09: qty 10

## 2020-01-09 MED ORDER — MIDAZOLAM HCL 5 MG/5ML IJ SOLN
INTRAMUSCULAR | Status: AC
  Filled 2020-01-09: qty 5

## 2020-01-09 MED ORDER — SODIUM CHLORIDE 0.9 % IV SOLN: INTRAVENOUS | Status: DC

## 2020-01-09 MED ORDER — MEPERIDINE HCL 50 MG/ML IJ SOLN
INTRAMUSCULAR | Status: DC
Start: 2020-01-09 — End: 2020-01-09
  Filled 2020-01-09: qty 1

## 2020-01-09 MED ORDER — MEPERIDINE HCL 50 MG/ML IJ SOLN
INTRAMUSCULAR | Status: DC | PRN
Start: 2020-01-09 — End: 2020-01-09
  Administered 2020-01-09 (×4): 25 mg

## 2020-01-09 MED ORDER — LIDOCAINE VISCOUS HCL 2 % MT SOLN
OROMUCOSAL | Status: AC
  Filled 2020-01-09: qty 15

## 2020-01-09 MED ORDER — MEPERIDINE HCL 50 MG/ML IJ SOLN
INTRAMUSCULAR | Status: AC
  Filled 2020-01-09: qty 1

## 2020-01-09 MED ORDER — STERILE WATER FOR IRRIGATION IR SOLN
Status: DC | PRN
  Administered 2020-01-09: 200 mL

## 2020-01-09 MED ORDER — ESOMEPRAZOLE MAGNESIUM 40 MG PO CPDR: 40.0000 mg | DELAYED_RELEASE_CAPSULE | Freq: Every day | ORAL | 5 refills | Status: DC

## 2020-01-09 MED ORDER — MIDAZOLAM HCL 5 MG/5ML IJ SOLN
INTRAMUSCULAR | Status: DC | PRN
  Administered 2020-01-09 (×4): 2 mg via INTRAVENOUS
  Administered 2020-01-09 (×2): 1 mg via INTRAVENOUS
  Administered 2020-01-09: 2 mg via INTRAVENOUS

## 2020-01-09 MED ORDER — LIDOCAINE VISCOUS HCL 2 % MT SOLN
OROMUCOSAL | Status: DC | PRN
  Administered 2020-01-09: 1 via OROMUCOSAL

## 2020-01-09 NOTE — Op Note (Signed)
[facility] Patient Name: Jasmine Patton Procedure Date: 01/09/2020 1:22 PM MRN: [id number] Date of Birth: 16-Feb-1951 Attending MD: Hildred Laser , MD CSN: [id number] Age: 69 Admit Type: Outpatient Procedure:                Upper GI endoscopy Indications:              Esophageal dysphagia, Gastro-esophageal reflux                            disease Providers:                Hildred Laser, MD, Janeece Riggers, RN, Lambert Mody, Raphael Gibney, Technician Referring MD:             Lowell Guitar. Lovena Le, DO Medicines:                Lidocaine spray, Meperidine 75 mg IV, Midazolam 10                            mg IV Complications:            No immediate complications. Estimated Blood Loss:     Estimated blood loss was minimal. Procedure:                Pre-Anesthesia Assessment:                           - Prior to the procedure, a History and Physical                            was performed, and patient medications and                            allergies were reviewed. The patient's tolerance of                            previous anesthesia was also reviewed. The risks                            and benefits of the procedure and the sedation                            options and risks were discussed with the patient.                            All questions were answered, and informed consent                            was obtained. Prior Anticoagulants: The patient has                            taken no previous anticoagulant or antiplatelet  agents except for NSAID medication. ASA Grade                            Assessment: III - A patient with severe systemic                            disease. After reviewing the risks and benefits,                            the patient was deemed in satisfactory condition to                            undergo the procedure.                           After obtaining informed consent, the  endoscope was                            passed under direct vision. Throughout the                            procedure, the patient's blood pressure, pulse, and                            oxygen saturations were monitored continuously. The                            [id number]-H190 ([id number]) scope was introduced through the                            mouth, and advanced to the second part of duodenum.                            The upper GI endoscopy was accomplished without                            difficulty. The patient tolerated the procedure                            well. Scope In: 1:39:37 PM Scope Out: 1:50:11 PM Total Procedure Duration: 0 hours 10 minutes 34 seconds  Findings:      The hypopharynx was normal.      A web was found in the proximal esophagus.      One benign-appearing, intrinsic mild stenosis was found 31 cm from the       incisors. The stenosis was traversed. The scope was withdrawn. Dilation       was performed with a Maloney dilator with mild resistance at 45 Fr. The       dilation site was examined following endoscope reinsertion and showed       mild mucosal disruption at proximal esophagus and at [place] no       perforation.      The Z-line was irregular and was found 31 cm from the incisors. Biopsies       were taken with a cold forceps  for histology. The pathology specimen was       placed into Bottle Number 1.      A 4 cm hiatal hernia was present.      The entire examined stomach was normal.      The duodenal bulb and second portion of the duodenum were normal. Impression:               - Normal hypopharynx.                           - Web in the proximal esophagus. Dilated.                           - Benign-appearing esophageal stenosis at GEJ.                            Dilated.                           - Z-line irregular, 31 cm from the incisors.                            Biopsied.                           - 4 cm hiatal hernia.                            - Normal stomach.                           - Normal duodenal bulb and second portion of the                            duodenum. Moderate Sedation:      Moderate (conscious) sedation was administered by the endoscopy nurse       and supervised by the endoscopist. The following parameters were       monitored: oxygen saturation, heart rate, blood pressure, CO2       capnography and response to care. Total physician intraservice time was       21 minutes. Recommendation:           - Patient has a contact number available for                            emergencies. The signs and symptoms of potential                            delayed complications were discussed with the                            patient. Return to normal activities tomorrow.                            Written discharge instructions were provided to the  patient.                           - Mechanical soft diet today.                           - Discontinue Pantoprazole but continue present                            medications.                           - Nexium 40 mg po qam.                           - NO Aspirin or NSAIDs.                           - Await pathology results.                           - Repeat upper endoscopy PRN. Procedure Code(s):        --- Professional ---                           [phone], Esophagogastroduodenoscopy, flexible,                            transoral; with biopsy, single or multiple                           43450, Dilation of esophagus, by unguided sound or                            bougie, single or multiple passes                           G0500, Moderate sedation services provided by the                            same physician or other qualified health care                            professional performing a gastrointestinal                            endoscopic service that sedation supports,                            requiring the  presence of an independent trained                            observer to assist in the monitoring of the                            patient's level of consciousness and physiological  status; initial 15 minutes of intra-service time;                            patient age 79 years or older (additional time may                            be reported with [phone], as appropriate) Diagnosis Code(s):        --- Professional ---                           Q39.4, Esophageal web                           K22.2, Esophageal obstruction                           K22.8, Other specified diseases of esophagus                           K44.9, Diaphragmatic hernia without obstruction or                            gangrene                           R13.14, Dysphagia, pharyngoesophageal phase                           K21.9, Gastro-esophageal reflux disease without                            esophagitis CPT copyright 2019 American Medical Association. All rights reserved. The codes documented in this report are preliminary and upon coder review may  be revised to meet current compliance requirements. Hildred Laser, MD Hildred Laser, MD 01/09/2020 2:37:57 PM This report has been signed electronically. Number of Addenda: 0

## 2020-01-09 NOTE — Telephone Encounter (Signed)
lvm to schedule appt.  

## 2020-01-09 NOTE — Telephone Encounter (Signed)
MyChart message sent to patient.

## 2020-01-09 NOTE — Op Note (Signed)
[facility] Patient Name: Jasmine Patton Procedure Date: 01/09/2020 1:53 PM MRN: [id number] Date of Birth: 17-Mar-1951 Attending MD: Hildred Laser , MD CSN: [id number] Age: 69 Admit Type: Outpatient Procedure:                Colonoscopy Indications:              Chronic diarrhea Providers:                Hildred Laser, MD, Janeece Riggers, RN, Lambert Mody, Raphael Gibney, Technician Referring MD:             Lowell Guitar. Lovena Le, DO Medicines:                Meperidine 25 mg IV, Midazolam 2 mg IV Complications:            No immediate complications. Estimated Blood Loss:     Estimated blood loss was minimal. Procedure:                Pre-Anesthesia Assessment:                           - Prior to the procedure, a History and Physical                            was performed, and patient medications and                            allergies were reviewed. The patient's tolerance of                            previous anesthesia was also reviewed. The risks                            and benefits of the procedure and the sedation                            options and risks were discussed with the patient.                            All questions were answered, and informed consent                            was obtained. Prior Anticoagulants: The patient has                            taken no previous anticoagulant or antiplatelet                            agents except for NSAID medication. ASA Grade                            Assessment: III - A patient with severe systemic  disease. After reviewing the risks and benefits,                            the patient was deemed in satisfactory condition to                            undergo the procedure.                           After obtaining informed consent, the colonoscope                            was passed under direct vision. Throughout the                             procedure, the patient's blood pressure, pulse, and                            oxygen saturations were monitored continuously. The                            PCF-H190DL ([id number]) scope was introduced through                            the anus and advanced to the the cecum, identified                            by appendiceal orifice and ileocecal valve. The                            colonoscopy was somewhat difficult due to                            inadequate bowel prep. The patient tolerated the                            procedure well. The quality of the bowel                            preparation was fair. The ileocecal valve,                            appendiceal orifice, and rectum were photographed. Scope In: 1:55:35 PM Scope Out: 2:20:15 PM Scope Withdrawal Time: 0 hours 8 minutes 40 seconds  Total Procedure Duration: 0 hours 24 minutes 40 seconds  Findings:      The perianal and digital rectal examinations were normal.      A diminutive polyp was found in the hepatic flexure. The polyp was       sessile. Biopsies were taken with a cold forceps for histology. The       pathology specimen was placed into Bottle Number 2.      The exam was otherwise normal throughout the examined colon.      Biopsies for histology were taken with a cold forceps from the ascending  colon and sigmoid colon for evaluation of microscopic colitis. The       pathology specimen was placed into Bottle Number 3.      External hemorrhoids were found during retroflexion. The hemorrhoids       were small. Impression:               - Preparation of the colon was fair.                           - One diminutive polyp at the hepatic flexure.                            Biopsied.                           - External hemorrhoids.                           - Biopsies were taken with a cold forceps from the                            ascending colon and sigmoid colon for evaluation of                             microscopic colitis. Moderate Sedation:      Moderate (conscious) sedation was administered by the endoscopy nurse       and supervised by the endoscopist. The following parameters were       monitored: oxygen saturation, heart rate, blood pressure, CO2       capnography and response to care. Total physician intraservice time was       25 minutes. Recommendation:           - Patient has a contact number available for                            emergencies. The signs and symptoms of potential                            delayed complications were discussed with the                            patient. Return to normal activities tomorrow.                            Written discharge instructions were provided to the                            patient.                           - High fiber diet today.                           - Continue present medications.                           - Repeat colonoscopy in  5 years.                           - Await pathology results.                           - See other note. Procedure Code(s):        --- Professional ---                           [phone], Colonoscopy, flexible; with biopsy, single                            or multiple                           99153, Moderate sedation; each additional 15                            minutes intraservice time                           G0500, Moderate sedation services provided by the                            same physician or other qualified health care                            professional performing a gastrointestinal                            endoscopic service that sedation supports,                            requiring the presence of an independent trained                            observer to assist in the monitoring of the                            patient's level of consciousness and physiological                            status; initial 15 minutes of intra-service time;                             patient age 75 years or older (additional time may                            be reported with [phone], as appropriate) Diagnosis Code(s):        --- Professional ---                           K64.4, Residual hemorrhoidal skin tags  K63.5, Polyp of colon                           K52.9, Noninfective gastroenteritis and colitis,                            unspecified CPT copyright 2019 American Medical Association. All rights reserved. The codes documented in this report are preliminary and upon coder review may  be revised to meet current compliance requirements. Hildred Laser, MD Hildred Laser, MD 01/09/2020 2:42:53 PM This report has been signed electronically. Number of Addenda: 0

## 2020-01-09 NOTE — H&P (Signed)
Jasmine Patton is an 69 y.o. female.   Chief Complaint: Patient is here for esophagogastroduodenoscopy with esophageal dilation and colonoscopy. HPI: Patient is 69 year old Caucasian female who has history of erosive reflux esophagitis who presents with intermittent dysphagia to solids.  She points to suprasternal area site of bolus obstruction.  She occasionally has difficulty with liquids or strangles.  No history of bronchitis or pneumonia.  She says heartburn is not well controlled with current therapy.  She sleeps with head end of bed elevated.  She has eliminated trigger foods.  She also has noted change in her bowel habits.  Used to be constipated and now she is having diarrhea and episodic incontinence and abdominal pain.  She has noted hematochezia usually in the small amount of blood with a bowel movement.  She denies anorexia or weight loss.  She underwent high rescreening colonoscopy in July 2018 with removal of single small cecal polyp was a tubular adenoma. Family history positive for colon carcinoma in her mother who was in her 43s at the time of diagnosis and developed breast carcinoma 35s and lived to be 60.  Past Medical History:  Diagnosis Date  . ADD (attention deficit disorder)    Adult  . Allergic rhinitis   . Anxiety   . Depression   . GERD (gastroesophageal reflux disease)   . Hyperlipidemia   . Insomnia    Chronic  . MS (multiple sclerosis) ([place])   . Osteopenia   . Positive H. pylori test   . Reflux     Past Surgical History:  Procedure Laterality Date  . COLONOSCOPY    . COLONOSCOPY N/A 10/12/2016   Procedure: COLONOSCOPY;  Surgeon: Rogene Houston, MD;  Location: [facility];  Service: Endoscopy;  Laterality: N/A;  . Cyst removed from left breast    . DILATION AND CURETTAGE OF UTERUS    . ESOPHAGOGASTRODUODENOSCOPY    . ESOPHAGOGASTRODUODENOSCOPY N/A 10/12/2016   Procedure: ESOPHAGOGASTRODUODENOSCOPY (EGD);  Surgeon: Rogene Houston, MD;  Location: [facility];  Service: Endoscopy;  Laterality: N/A;  1:45  . POLYPECTOMY  10/12/2016   Procedure: POLYPECTOMY;  Surgeon: Rogene Houston, MD;  Location: [facility];  Service: Endoscopy;;  cecal     Family History  Problem Relation Age of Onset  . Cancer Mother        Colon  . Colon cancer Mother   . Hypertension Father    Social History:  reports that she has never smoked. She has never used smokeless tobacco. She reports current alcohol use. She reports that she does not use drugs.  Allergies:  Allergies  Allergen Reactions  . Ambien [Zolpidem Tartrate] Other (See Comments)    forgetfulness   . Etodolac Rash    Medications Prior to Admission  Medication Sig Dispense Refill  . BIOTIN PO Take 100 mg by mouth daily.     . Calcium Carbonate Antacid (MAALOX) 600 MG chewable tablet Chew 600 mg by mouth at bedtime.    . Cholecalciferol 50 MCG (2000 UT) TABS Take 2,000 Units by mouth daily.     . clonazePAM (KLONOPIN) 1 MG tablet TAKE ONE TABLET BY MOUTH EVERY NIGHT AT BEDTIME AS NEEDED FOR ANXIETY (Patient taking differently: Take 1 mg by mouth at bedtime. ) 30 tablet 5  . Cyanocobalamin 3000 MCG CAPS Take 3,000 mcg by mouth daily.     Marland Kitchen desvenlafaxine (PRISTIQ) 100 MG 24 hr tablet TAKE ONE (1) TABLET EACH DAY (Patient taking differently: Take  100 mg by mouth daily. ) 90 tablet 1  . diphenhydramine-acetaminophen (TYLENOL PM) 25-500 MG TABS tablet Take 1 tablet by mouth daily as needed (Headache/ back pain).    Marland Kitchen doxepin (SINEQUAN) 50 MG capsule Take 1 capsule (50 mg total) by mouth at bedtime. 90 capsule 1  . Hyoscyamine Sulfate SL (LEVSIN/SL) 0.125 MG SUBL Place 0.125 mg under the tongue 2 (two) times daily as needed (abd cramping, diarrhea). 60 tablet 3  . Ibuprofen-diphenhydrAMINE HCl (IBUPROFEN PM) 200-25 MG CAPS Take 2 tablets by mouth daily as needed (Headache/back pain).    . pantoprazole (PROTONIX) 40 MG tablet TAKE ONE TABLET BY MOUTH DAILY 30 MINUTES BEFORE BREAKFAST  (Patient taking differently: Take 40 mg by mouth daily. ) 90 tablet 1  . rosuvastatin (CRESTOR) 10 MG tablet TAKE ONE (1) TABLET BY MOUTH EVERY DAY FOR CHOLESTEROL (Patient taking differently: Take 10 mg by mouth daily. ) 90 tablet 1  . tiZANidine (ZANAFLEX) 4 MG tablet Take one tablet po prn spasms (Patient taking differently: Take 4 mg by mouth daily as needed for muscle spasms. ) 30 tablet 2  . Wheat Dextrin (BENEFIBER PO) Take 1 tablet by mouth daily.    . Melatonin 300 MCG TABS Take 300 mcg by mouth at bedtime.    Vladimir Faster Glycol-Propyl Glycol (SYSTANE) 0.4-0.3 % SOLN Place 1 application into both eyes daily as needed (Dry eye).      Results for orders placed or performed during the hospital encounter of 01/07/20 (from the past 48 hour(s))  SARS CORONAVIRUS 2 (TAT 6-24 HRS) Nasopharyngeal Nasopharyngeal Swab     Status: None   Collection Time: 01/07/20  2:30 PM   Specimen: Nasopharyngeal Swab  Result Value Ref Range   SARS Coronavirus 2 NEGATIVE NEGATIVE    Comment: (NOTE) SARS-CoV-2 target nucleic acids are NOT DETECTED.  The SARS-CoV-2 RNA is generally detectable in upper and lower respiratory specimens during the acute phase of infection. Negative results do not preclude SARS-CoV-2 infection, do not rule out co-infections with other pathogens, and should not be used as the sole basis for treatment or other patient management decisions. Negative results must be combined with clinical observations, patient history, and epidemiological information. The expected result is Negative.  Fact Sheet for Patients: SugarRoll.be  Fact Sheet for Healthcare Providers: https://www.woods-mathews.com/  This test is not yet approved or cleared by the [place] FDA and  has been authorized for detection and/or diagnosis of SARS-CoV-2 by FDA under an Emergency Use Authorization (EUA). This EUA will remain  in effect (meaning this test can be used)  for the duration of the COVID-19 declaration under Se ction 564(b)(1) of the Act, 21 U.S.C. section 360bbb-3(b)(1), unless the authorization is terminated or revoked sooner.  Performed at [facility], [place] [address], [place], [place] [address]    No results found.  Review of Systems  Blood pressure (!) 132/52, pulse 97, temperature 99.2 F (37.3 C), temperature source Oral, resp. rate 18, height 5\' 1"  (1.549 m), weight 77.1 kg, SpO2 100 %. Physical Exam HENT:     Mouth/Throat:     Mouth: Mucous membranes are moist.     Pharynx: Oropharynx is clear.  Eyes:     General: No scleral icterus.    Conjunctiva/sclera: Conjunctivae normal.  Cardiovascular:     Rate and Rhythm: Normal rate and regular rhythm.     Heart sounds: Normal heart sounds. No murmur heard.   Pulmonary:     Effort: Pulmonary effort is  normal.     Breath sounds: Normal breath sounds.  Abdominal:     General: There is no distension.     Palpations: Abdomen is soft. There is no mass.     Tenderness: There is no abdominal tenderness.  Musculoskeletal:     Cervical back: Neck supple.  Lymphadenopathy:     Cervical: No cervical adenopathy.  Skin:    General: Skin is warm.  Neurological:     Mental Status: She is alert.      Assessment/Plan  Esophageal dysphagia in patient with chronic GERD. Change in bowel habits with diarrhea and incontinence. Esophagogastroduodenoscopy with esophageal dilation and diagnostic colonoscopy.  Hildred Laser, MD 01/09/2020, 1:23 PM

## 2020-01-09 NOTE — Discharge Instructions (Signed)
Colonoscopy, Adult, Care After This sheet gives you information about how to care for yourself after your procedure. Your doctor may also give you more specific instructions. If you have problems or questions, call your doctor. What can I expect after the procedure? After the procedure, it is common to have:  A small amount of blood in your poop (stool) for 24 hours.  Some gas.  Mild cramping or bloating in your belly (abdomen). Follow these instructions at home: Eating and drinking   Drink enough fluid to keep your pee (urine) pale yellow.  Follow instructions from your doctor about what you cannot eat or drink.  Return to your normal diet as told by your doctor. Avoid heavy or fried foods that are hard to digest. Activity  Rest as told by your doctor.  Do not sit for a long time without moving. Get up to take short walks every 1-2 hours. This is important. Ask for help if you feel weak or unsteady.  Return to your normal activities as told by your doctor. Ask your doctor what activities are safe for you. To help cramping and bloating:   Try walking around.  Put heat on your belly as told by your doctor. Use the heat source that your doctor recommends, such as a moist heat pack or a heating pad. ? Put a towel between your skin and the heat source. ? Leave the heat on for 20-30 minutes. ? Remove the heat if your skin turns bright red. This is very important if you are unable to feel pain, heat, or cold. You may have a greater risk of getting burned. General instructions  For the first 24 hours after the procedure: ? Do not drive or use machinery. ? Do not sign important documents. ? Do not drink alcohol. ? Do your daily activities more slowly than normal. ? Eat foods that are soft and easy to digest.  Take over-the-counter or prescription medicines only as told by your doctor.  Keep all follow-up visits as told by your doctor. This is important. Contact a doctor  if:  You have blood in your poop 2-3 days after the procedure. Get help right away if:  You have more than a small amount of blood in your poop.  You see large clumps of tissue (blood clots) in your poop.  Your belly is swollen.  You feel like you may vomit (nauseous).  You vomit.  You have a fever.  You have belly pain that gets worse, and medicine does not help your pain. Summary  After the procedure, it is common to have a small amount of blood in your poop. You may also have mild cramping and bloating in your belly.  For the first 24 hours after the procedure, do not drive or use machinery, do not sign important documents, and do not drink alcohol.  Get help right away if you have a lot of blood in your poop, feel like you may vomit, have a fever, or have more belly pain. This information is not intended to replace advice given to you by your health care provider. Make sure you discuss any questions you have with your health care provider. Document Revised: 10/15/2018 Document Reviewed: 10/15/2018 Elsevier Patient Education  [place]. Colon Polyps  Polyps are tissue growths inside the body. Polyps can grow in many places, including the large intestine (colon). A polyp may be a round bump or a mushroom-shaped growth. You could have one polyp or several. Most  colon polyps are noncancerous (benign). However, some colon polyps can become cancerous over time. Finding and removing the polyps early can help prevent this. What are the causes? The exact cause of colon polyps is not known. What increases the risk? You are more likely to develop this condition if you:  Have a family history of colon cancer or colon polyps.  Are older than 10 or older than 45 if you are African American.  Have inflammatory bowel disease, such as ulcerative colitis or Crohn's disease.  Have certain hereditary conditions, such as: ? Familial adenomatous polyposis. ? Lynch  syndrome. ? [place] syndrome. ? Peutz-Jeghers syndrome.  Are overweight.  Smoke cigarettes.  Do not get enough exercise.  Drink too much alcohol.  Eat a diet that is high in fat and red meat and low in fiber.  Had childhood cancer that was treated with abdominal radiation. What are the signs or symptoms? Most polyps do not cause symptoms. If you have symptoms, they may include:  Blood coming from your rectum when having a bowel movement.  Blood in your stool. The stool may look dark red or black.  Abdominal pain.  A change in bowel habits, such as constipation or diarrhea. How is this diagnosed? This condition is diagnosed with a colonoscopy. This is a procedure in which a lighted, flexible scope is inserted into the anus and then passed into the colon to examine the area. Polyps are sometimes found when a colonoscopy is done as part of routine cancer screening tests. How is this treated? Treatment for this condition involves removing any polyps that are found. Most polyps can be removed during a colonoscopy. Those polyps will then be tested for cancer. Additional treatment may be needed depending on the results of testing. Follow these instructions at home: Lifestyle  Maintain a healthy weight, or lose weight if recommended by your health care provider.  Exercise every day or as told by your health care provider.  Do not use any products that contain nicotine or tobacco, such as cigarettes and e-cigarettes. If you need help quitting, ask your health care provider.  If you drink alcohol, limit how much you have: ? 0-1 drink a day for women. ? 0-2 drinks a day for men.  Be aware of how much alcohol is in your drink. In the [place] one drink equals one 12 oz bottle of beer (355 mL), one 5 oz glass of wine (148 mL), or one 1 oz shot of hard liquor (44 mL). Eating and drinking   Eat foods that are high in fiber, such as fruits, vegetables, and whole grains.  Eat foods that  are high in calcium and vitamin D, such as milk, cheese, yogurt, eggs, liver, fish, and broccoli.  Limit foods that are high in fat, such as fried foods and desserts.  Limit the amount of red meat and processed meat you eat, such as hot dogs, sausage, bacon, and lunch meats. General instructions  Keep all follow-up visits as told by your health care provider. This is important. ? This includes having regularly scheduled colonoscopies. ? Talk to your health care provider about when you need a colonoscopy. Contact a health care provider if:  You have new or worsening bleeding during a bowel movement.  You have new or increased blood in your stool.  You have a change in bowel habits.  You lose weight for no known reason. Summary  Polyps are tissue growths inside the body. Polyps can grow in many places,  including the colon.  Most colon polyps are noncancerous (benign), but some can become cancerous over time.  This condition is diagnosed with a colonoscopy.  Treatment for this condition involves removing any polyps that are found. Most polyps can be removed during a colonoscopy. This information is not intended to replace advice given to you by your health care provider. Make sure you discuss any questions you have with your health care provider. Document Revised: 07/06/2017 Document Reviewed: 07/06/2017 Elsevier Patient Education  [place]. Esophageal Dilatation Esophageal dilatation, also called esophageal dilation, is a procedure to widen or open (dilate) a blocked or narrowed part of the esophagus. The esophagus is the part of the body that moves food and liquid from the mouth to the stomach. You may need this procedure if:  You have a buildup of scar tissue in your esophagus that makes it difficult, painful, or impossible to swallow. This can be caused by gastroesophageal reflux disease (GERD).  You have cancer of the esophagus.  There is a problem with how food  moves through your esophagus. In some cases, you may need this procedure repeated at a later time to dilate the esophagus gradually. Tell a health care provider about:  Any allergies you have.  All medicines you are taking, including vitamins, herbs, eye drops, creams, and over-the-counter medicines.  Any problems you or family members have had with anesthetic medicines.  Any blood disorders you have.  Any surgeries you have had.  Any medical conditions you have.  Any antibiotic medicines you are required to take before dental procedures.  Whether you are pregnant or may be pregnant. What are the risks? Generally, this is a safe procedure. However, problems may occur, including:  Bleeding due to a tear in the lining of the esophagus.  A hole (perforation) in the esophagus. What happens before the procedure?  Follow instructions from your health care provider about eating or drinking restrictions.  Ask your health care provider about changing or stopping your regular medicines. This is especially important if you are taking diabetes medicines or blood thinners.  Plan to have someone take you home from the hospital or clinic.  Plan to have a responsible adult care for you for at least 24 hours after you leave the hospital or clinic. This is important. What happens during the procedure?  You may be given a medicine to help you relax (sedative).  A numbing medicine may be sprayed into the back of your throat, or you may gargle the medicine.  Your health care provider may perform the dilatation using various surgical instruments, such as: ? Simple dilators. This instrument is carefully placed in the esophagus to stretch it. ? Guided wire bougies. This involves using an endoscope to insert a wire into the esophagus. A dilator is passed over this wire to enlarge the esophagus. Then the wire is removed. ? Balloon dilators. An endoscope with a small balloon at the end is inserted  into the esophagus. The balloon is inflated to stretch the esophagus and open it up. The procedure may vary among health care providers and hospitals. What happens after the procedure?  Your blood pressure, heart rate, breathing rate, and blood oxygen level will be monitored until the medicines you were given have worn off.  Your throat may feel slightly sore and numb. This will improve slowly over time.  You will not be allowed to eat or drink until your throat is no longer numb.  When you are able to  drink, urinate, and sit on the edge of the bed without nausea or dizziness, you may be able to return home. Follow these instructions at home:  Take over-the-counter and prescription medicines only as told by your health care provider.  Do not drive for 24 hours if you were given a sedative during your procedure.  You should have a responsible adult with you for 24 hours after the procedure.  Follow instructions from your health care provider about any eating or drinking restrictions.  Do not use any products that contain nicotine or tobacco, such as cigarettes and e-cigarettes. If you need help quitting, ask your health care provider.  Keep all follow-up visits as told by your health care provider. This is important. Get help right away if you:  Have a fever.  Have chest pain.  Have pain that is not relieved by medication.  Have trouble breathing.  Have trouble swallowing.  Vomit blood. Summary  Esophageal dilatation, also called esophageal dilation, is a procedure to widen or open (dilate) a blocked or narrowed part of the esophagus.  Plan to have someone take you home from the hospital or clinic.  For this procedure, a numbing medicine may be sprayed into the back of your throat, or you may gargle the medicine.  Do not drive for 24 hours if you were given a sedative during your procedure. This information is not intended to replace advice given to you by your health  care provider. Make sure you discuss any questions you have with your health care provider. Document Revised: 01/16/2019 Document Reviewed: 01/24/2017 Elsevier Patient Education  2020 [place]. Upper Endoscopy, Adult, Care After This sheet gives you information about how to care for yourself after your procedure. Your health care provider may also give you more specific instructions. If you have problems or questions, contact your health care provider. What can I expect after the procedure? After the procedure, it is common to have:  A sore throat.  Mild stomach pain or discomfort.  Bloating.  Nausea. Follow these instructions at home:   Follow instructions from your health care provider about what to eat or drink after your procedure.  Return to your normal activities as told by your health care provider. Ask your health care provider what activities are safe for you.  Take over-the-counter and prescription medicines only as told by your health care provider.  Do not drive for 24 hours if you were given a sedative during your procedure.  Keep all follow-up visits as told by your health care provider. This is important. Contact a health care provider if you have:  A sore throat that lasts longer than one day.  Trouble swallowing. Get help right away if:  You vomit blood or your vomit looks like coffee grounds.  You have: ? A fever. ? Bloody, black, or tarry stools. ? A severe sore throat or you cannot swallow. ? Difficulty breathing. ? Severe pain in your chest or abdomen. Summary  After the procedure, it is common to have a sore throat, mild stomach discomfort, bloating, and nausea.  Do not drive for 24 hours if you were given a sedative during the procedure.  Follow instructions from your health care provider about what to eat or drink after your procedure.  Return to your normal activities as told by your health care provider. This information is not  intended to replace advice given to you by your health care provider. Make sure you discuss any questions you have with  your health care provider. Document Revised: 09/12/2017 Document Reviewed: 08/21/2017 Elsevier Patient Education  [place].   No aspirin or NSAIDs for 3 days. Discontinue pantoprazole. Begin Nexium/esomeprazole 40 mg by mouth 30 minutes before breakfast daily. Resume other medications as before. Mechanical soft diet for 24 hours and thereafter usual diet.  Remember to chew food thoroughly before you swallow. No driving for 24 hours. Physician will call with biopsy results.

## 2020-01-14 ENCOUNTER — Encounter: Payer: Self-pay | Admitting: Family Medicine

## 2020-01-14 ENCOUNTER — Encounter (HOSPITAL_COMMUNITY): Payer: Self-pay | Admitting: Internal Medicine

## 2020-01-14 DIAGNOSIS — D126 Benign neoplasm of colon, unspecified: Secondary | ICD-10-CM | POA: Insufficient documentation

## 2020-01-14 LAB — SURGICAL PATHOLOGY

## 2020-01-16 ENCOUNTER — Telehealth (INDEPENDENT_AMBULATORY_CARE_PROVIDER_SITE_OTHER): Payer: Self-pay | Admitting: Gastroenterology

## 2020-01-16 NOTE — Telephone Encounter (Signed)
Noted. Will see patient on Monday. Thanks.

## 2020-01-16 NOTE — Telephone Encounter (Signed)
Jasmine Patton is asking what does she do to treat the diarrhea, what would be the next step to control the diarrhea, please advise?

## 2020-01-16 NOTE — Telephone Encounter (Signed)
Jasmine Patton states that the [place] does help but she is unsure of how often she should take it and she did schedule an appointment for Monday to come in an discuss symptoms and treatment plan

## 2020-01-16 NOTE — Telephone Encounter (Signed)
Can you check if Levsin is helping her?  It may be easiest to see her back in the office to discuss her symptoms further and update treatment plan.

## 2020-01-16 NOTE — Telephone Encounter (Signed)
I believe patient is calling regarding pathology results.  Please let her know Dr. Laural Golden will review and contact her I did preliminary review and she not have any evidence of malignancy.  She did have one small adenoma Dr. Laural Golden will likely want to follow-up in 5 to 7 years with another colonoscopy. Thanks.

## 2020-01-16 NOTE — Telephone Encounter (Signed)
Patient left voice mail message regarding test results - ph# [phone]

## 2020-01-17 ENCOUNTER — Encounter (INDEPENDENT_AMBULATORY_CARE_PROVIDER_SITE_OTHER): Payer: Self-pay | Admitting: *Deleted

## 2020-01-20 ENCOUNTER — Telehealth (INDEPENDENT_AMBULATORY_CARE_PROVIDER_SITE_OTHER): Payer: Self-pay | Admitting: *Deleted

## 2020-01-20 ENCOUNTER — Ambulatory Visit (INDEPENDENT_AMBULATORY_CARE_PROVIDER_SITE_OTHER): Payer: Medicare PPO | Admitting: Gastroenterology

## 2020-01-20 NOTE — Telephone Encounter (Signed)
Patient was a no show for her visit with Laurine Blazer, PA. Today 01/20/2020

## 2020-01-22 ENCOUNTER — Telehealth: Payer: Self-pay

## 2020-01-22 ENCOUNTER — Other Ambulatory Visit: Payer: Self-pay

## 2020-01-22 ENCOUNTER — Encounter: Payer: Self-pay | Admitting: Family Medicine

## 2020-01-22 ENCOUNTER — Ambulatory Visit (INDEPENDENT_AMBULATORY_CARE_PROVIDER_SITE_OTHER): Payer: Medicare PPO | Admitting: Family Medicine

## 2020-01-22 VITALS — BP 118/72 | HR 121 | Temp 97.9°F | Wt 176.0 lb

## 2020-01-22 DIAGNOSIS — Z79899 Other long term (current) drug therapy: Secondary | ICD-10-CM | POA: Diagnosis not present

## 2020-01-22 DIAGNOSIS — Z1231 Encounter for screening mammogram for malignant neoplasm of breast: Secondary | ICD-10-CM

## 2020-01-22 DIAGNOSIS — Z23 Encounter for immunization: Secondary | ICD-10-CM | POA: Diagnosis not present

## 2020-01-22 DIAGNOSIS — Z131 Encounter for screening for diabetes mellitus: Secondary | ICD-10-CM

## 2020-01-22 DIAGNOSIS — Z780 Asymptomatic menopausal state: Secondary | ICD-10-CM | POA: Diagnosis not present

## 2020-01-22 DIAGNOSIS — Z13820 Encounter for screening for osteoporosis: Secondary | ICD-10-CM | POA: Diagnosis not present

## 2020-01-22 DIAGNOSIS — E785 Hyperlipidemia, unspecified: Secondary | ICD-10-CM

## 2020-01-22 MED ORDER — DESVENLAFAXINE SUCCINATE ER 100 MG PO TB24: 100.0000 mg | ORAL_TABLET | Freq: Every day | ORAL | 1 refills | Status: DC

## 2020-01-22 MED ORDER — ROSUVASTATIN CALCIUM 10 MG PO TABS: 10.0000 mg | ORAL_TABLET | Freq: Every day | ORAL | 1 refills | Status: DC

## 2020-01-22 MED ORDER — TIZANIDINE HCL 4 MG PO TABS: 4.0000 mg | ORAL_TABLET | Freq: Every day | ORAL | 1 refills | Status: DC | PRN

## 2020-01-22 NOTE — Patient Instructions (Signed)
Basics of Medicine Management Taking your medicines correctly is an important part of managing or preventing medical problems. Make sure you know what disease or condition your medicine is treating, and how and when to take it. If you do not take your medicine correctly, it may not work well and may cause unpleasant side effects, including serious health problems. What should I do when I am taking medicines?   Read all the labels and inserts that come with your medicines. Review the information often.  Talk with your pharmacist if you get a refill and notice a change in the size, color, or shape of your medicines.  Know the potential side effects for each medicine that you take.  Try to get all your medicines from the same pharmacy. The pharmacist will have all your information and will understand how your medicines will affect each other (interact).  Tell your health care provider about all your medicines, including over-the-counter medicines, vitamins, and herbal or dietary supplements. He or she will make sure that nothing will interact with any of your prescribed medicines. How can I take my medicines safely?  Take medicines only as told by your health care provider. ? Do not take more of your medicine than instructed. ? Do not take anyone else's medicines. ? Do not share your medicines with others. ? Do not stop taking your medicines unless your health care provider tells you to do so. ? You may need to avoid alcohol or certain foods or liquids when taking certain medicines. Follow your health care provider's instructions.  Do not split, mash, or chew your medicines unless your health care provider tells you to do so. Tell your health care provider if you have trouble swallowing your medicines.  For liquid medicine, use the dosing container that was provided. How should I organize my medicines?  Know your medicines  Know what each of your medicines looks like. This includes  size, color, and shape. Tell your health care provider if you are having trouble recognizing all the medicines that you are taking.  If you cannot tell your medicines apart because they look similar, keep them in original bottles.  If you cannot read the labels on the bottles, tell your pharmacist to put your medicines in containers with large print.  Review your medicines and your schedule with family members, a friend, or a caregiver. Use a pill organizer  Use a tool to organize your medicine schedule. Tools include a weekly pillbox, a written chart, a notebook, or a calendar.  Your tool should help you remember the following things about each medicine: ? The name of the medicine. ? The amount (dose) to take. ? The schedule. This is the day and time the medicine should be taken. ? The appearance. This includes color, shape, size, and stamp. ? How to take your medicines. This includes instructions to take them with food, without food, with fluids, or with other medicines.  Create reminders for taking your medicines. Use sticky notes, or alarms on your watch, mobile device, or phone calendar.  You may choose to use a more advanced management system. These systems have storage, alarms, and visual and audio prompts.  Some medicines can be taken on an "as-needed" basis. These include medicines for nausea or pain. If you take an as-needed medicine, write down the name and dose, as well as the date and time that you took it. How should I plan for travel?  Take your pillbox, medicines, and organization  system with you when traveling.  Have your medicines refilled before you travel. This will ensure that you do not run out of your medicines while you are away from home.  Always carry an updated list of your medicines with you. If there is an emergency, a first responder can quickly see what medicines you are taking.  Do not pack your medicines in checked luggage in case your luggage is lost  or delayed.  If any of your medicines is considered a controlled substance, make sure you bring a letter from your health care provider with you. How should I store and discard my medicines? For safe storage:  Store medicines in a cool, dry area away from light, or as directed by your health care provider. Do not store medicines in the bathroom. Heat and humidity will affect them.  Do not store your medicines with other chemicals, or with medicines for pets or other household members.  Keep medicines away from children and pets. Do not leave them on counters or bedside tables. Store them in high cabinets or on high shelves. For safe disposal:  Check expiration dates regularly. Do not take expired medicines. Discard medicines that are older than the expiration date.  Learn a safe way to dispose of your medicines. You may: ? Use a local government, hospital, or pharmacy medicine-take-back program. ? Mix the medicines with inedible substances, put them in a sealed bag or empty container, and throw them in the trash. What should I remember?  Tell your health care provider if you: ? Experience side effects. ? Have new symptoms. ? Have other concerns about taking your medicines.  Review your medicines regularly with your health care provider. Other medicines, diet, medical conditions, weight changes, and daily habits can all affect how medicines work. Ask if you need to continue taking each medicine, and discuss how well each one is working.  Refill your medicines early to avoid running out of them.  In case of an accidental overdose, call your local [place] at [phone] or visit your local emergency department immediately. This is important. Summary  Taking your medicines correctly is an important part of managing or preventing medical problems.  You need to make sure that you understand what you are taking a medicine for, as well as how and when you need to take  it.  Know your medicines and use a pill organizer to help you take your medicines correctly.  In case of an accidental overdose, call your local [place] at [phone] or visit your local emergency department immediately. This is important. This information is not intended to replace advice given to you by your health care provider. Make sure you discuss any questions you have with your health care provider. Document Revised: 03/16/2017 Document Reviewed: 03/16/2017 Elsevier Patient Education  2020 [employer].

## 2020-01-22 NOTE — Telephone Encounter (Signed)
Patient has physical on 11/16 and needing labs

## 2020-01-22 NOTE — Telephone Encounter (Signed)
Last labs completed 08/22/19 TSH, Hepatic, Lipid and BMET. Please advise. Thank you

## 2020-01-22 NOTE — Progress Notes (Signed)
Patient ID: Jasmine Patton, female    DOB: 19-Mar-1951, 69 y.o.   MRN: [id number]   Chief Complaint  Patient presents with  . Follow-up    Patient reports no concerns at this time. Seeing gastro for chronic diarrhea. Requesting refill on regular medicines and Amphetamine salt 10mg  which Dr. Richardson Landry has prescribed in the past.    Subjective:  Cc: medication management  Patient presents today for medication management.  Her last office visit was July 17, 2019.  Her last annual physical was April 25, 2018.  Her last lab work was drawn in May 2021, and overall looked well.  She is yet to establish care with her new primary care physician, Dr. Lovena Le.  Today I will fill her cholesterol medication, her muscle relaxant, and her SNRI.  She will follow up with Dr. Lovena Le for annual physical and discuss her clonazepam.  Medications that she received from other providers, we discussed, and we will not refill today.  She will touch base with those providers for those medications.  She does not have any new health concerns, specialist that she sees include gastrointestinal, and neurology for her MS.    Medical History Akeria has a past medical history of ADD (attention deficit disorder), Allergic rhinitis, Anxiety, Depression, GERD (gastroesophageal reflux disease), Hyperlipidemia, Insomnia, MS (multiple sclerosis) ([place]), Osteopenia, Positive H. pylori test, and Reflux.   Outpatient Encounter Medications as of 01/22/2020  Medication Sig  . BIOTIN PO Take 100 mg by mouth daily.   . Calcium Carbonate Antacid (MAALOX) 600 MG chewable tablet Chew 600 mg by mouth at bedtime.  . Cholecalciferol 50 MCG (2000 UT) TABS Take 2,000 Units by mouth daily.   . clonazePAM (KLONOPIN) 1 MG tablet TAKE ONE TABLET BY MOUTH EVERY NIGHT AT BEDTIME AS NEEDED FOR ANXIETY (Patient taking differently: Take 1 mg by mouth at bedtime. )  . Cyanocobalamin 3000 MCG CAPS Take 3,000 mcg by mouth daily.   Marland Kitchen desvenlafaxine (PRISTIQ)  100 MG 24 hr tablet Take 1 tablet (100 mg total) by mouth daily.  . diphenhydramine-acetaminophen (TYLENOL PM) 25-500 MG TABS tablet Take 1 tablet by mouth daily as needed (Headache/ back pain).  Marland Kitchen doxepin (SINEQUAN) 50 MG capsule Take 1 capsule (50 mg total) by mouth at bedtime.  Marland Kitchen esomeprazole (NEXIUM) 40 MG capsule Take 1 capsule (40 mg total) by mouth daily before breakfast.  . Hyoscyamine Sulfate SL (LEVSIN/SL) 0.125 MG SUBL Place 0.125 mg under the tongue 2 (two) times daily as needed (abd cramping, diarrhea).  . Ibuprofen-diphenhydrAMINE HCl (IBUPROFEN PM) 200-25 MG CAPS Take 2 tablets by mouth daily as needed (Headache/back pain).  . Melatonin 300 MCG TABS Take 300 mcg by mouth at bedtime.  Vladimir Faster Glycol-Propyl Glycol (SYSTANE) 0.4-0.3 % SOLN Place 1 application into both eyes daily as needed (Dry eye).  . rosuvastatin (CRESTOR) 10 MG tablet Take 1 tablet (10 mg total) by mouth daily.  Marland Kitchen tiZANidine (ZANAFLEX) 4 MG tablet Take 1 tablet (4 mg total) by mouth daily as needed for muscle spasms.  . Wheat Dextrin (BENEFIBER PO) Take 1 tablet by mouth daily.  . [DISCONTINUED] desvenlafaxine (PRISTIQ) 100 MG 24 hr tablet TAKE ONE (1) TABLET EACH DAY (Patient taking differently: Take 100 mg by mouth daily. )  . [DISCONTINUED] rosuvastatin (CRESTOR) 10 MG tablet TAKE ONE (1) TABLET BY MOUTH EVERY DAY FOR CHOLESTEROL (Patient taking differently: Take 10 mg by mouth daily. )  . [DISCONTINUED] tiZANidine (ZANAFLEX) 4 MG tablet Take one tablet po prn  spasms (Patient taking differently: Take 4 mg by mouth daily as needed for muscle spasms. )   No facility-administered encounter medications on file as of 01/22/2020.     Review of Systems  Eyes: Negative.   Respiratory: Negative for cough and shortness of breath.   Cardiovascular: Negative for chest pain.  Gastrointestinal: Negative for abdominal pain, blood in stool, constipation, diarrhea, nausea and vomiting.  Endocrine: Negative.    Genitourinary: Negative.   Neurological:       Has multiple sclerosis.   Psychiatric/Behavioral: Negative.      Vitals BP 118/72   Pulse (!) 121   Temp 97.9 F (36.6 C)   Wt 176 lb (79.8 kg)   SpO2 99%   BMI 33.25 kg/m   Objective:   Physical Exam Vitals and nursing note reviewed.  Constitutional:      Appearance: Normal appearance.  Cardiovascular:     Rate and Rhythm: Normal rate and regular rhythm.     Heart sounds: Normal heart sounds.  Pulmonary:     Effort: Pulmonary effort is normal.     Breath sounds: Normal breath sounds.  Abdominal:     Palpations: Abdomen is soft.     Tenderness: There is no abdominal tenderness.  Skin:    General: Skin is warm and dry.  Neurological:     Mental Status: She is alert and oriented to person, place, and time.  Psychiatric:        Mood and Affect: Mood normal.        Behavior: Behavior normal.        Thought Content: Thought content normal.        Judgment: Judgment normal.     Comments: PHQ-9: 9 GAD: 6      Assessment and Plan   1. Encounter for screening mammogram for malignant neoplasm of breast - MM DIGITAL SCREENING BILATERAL  2. Screening for osteoporosis - DG Bone Density  3. Post-menopausal - MM DIGITAL SCREENING BILATERAL - DG Bone Density  4. Need for vaccination - Flu Vaccine QUAD High Dose(Fluad)  5. Medication management - desvenlafaxine (PRISTIQ) 100 MG 24 hr tablet; Take 1 tablet (100 mg total) by mouth daily.  Dispense: 30 tablet; Refill: 1 - rosuvastatin (CRESTOR) 10 MG tablet; Take 1 tablet (10 mg total) by mouth daily.  Dispense: 30 tablet; Refill: 1 - tiZANidine (ZANAFLEX) 4 MG tablet; Take 1 tablet (4 mg total) by mouth daily as needed for muscle spasms.  Dispense: 30 tablet; Refill: 1   Refill sent today for Crestor, Zanaflex,  and Pristiq.  She is tolerating these medications well.  She will follow up with Dr. Lovena Le for management of her clonazepam.   Since she has not had an annual  exam in over a year, she will schedule with Dr. Lovena Le within the next month or 2.  Her last labs were drawn May 2021.  Specialist: Gastrointestinal and neurology. Referrals today for health maintenance: Screening mammogram and bone density.  She will get her flu shot today.  Agrees with plan of care discussed today. Understands warning signs to seek further care: Chest pain, shortness of breath, any significant changes in health status. Understands to follow-up in 1-2 months for annual wellness with Dr. Lovena Le.   Pecolia Ades, FNP-C

## 2020-01-24 ENCOUNTER — Other Ambulatory Visit: Payer: Self-pay | Admitting: Family Medicine

## 2020-01-24 ENCOUNTER — Telehealth: Payer: Self-pay | Admitting: *Deleted

## 2020-01-24 MED ORDER — CLONAZEPAM 1 MG PO TABS: ORAL_TABLET | ORAL | 0 refills | Status: DC

## 2020-01-24 NOTE — Telephone Encounter (Signed)
Called patient per Santiago Glad- She states she has been doing ok but having trouble sleeping the past 2 nights. She took some PM medication last night but doesn't want to take that every night. Patient requesting small amount to have if needed until her appointment with Dr. Lovena Le.

## 2020-01-24 NOTE — Telephone Encounter (Signed)
Pls order cbc, cmp, lipid panel, and hba1c. Thx. Dr. Lovena Le

## 2020-01-24 NOTE — Telephone Encounter (Signed)
Lab orders placed and pt is advised.

## 2020-01-24 NOTE — Telephone Encounter (Signed)
20 tablets sent to [facility] [place]. Santiago Glad

## 2020-01-24 NOTE — Telephone Encounter (Signed)
Patient notified

## 2020-01-27 ENCOUNTER — Ambulatory Visit (INDEPENDENT_AMBULATORY_CARE_PROVIDER_SITE_OTHER): Payer: Medicare PPO | Admitting: Gastroenterology

## 2020-02-06 ENCOUNTER — Encounter (HOSPITAL_COMMUNITY): Payer: Self-pay

## 2020-02-06 ENCOUNTER — Ambulatory Visit (HOSPITAL_COMMUNITY): Admission: RE | Admit: 2020-02-06 | Payer: Medicare PPO | Source: Ambulatory Visit

## 2020-02-06 ENCOUNTER — Ambulatory Visit (HOSPITAL_COMMUNITY)
Admission: RE | Admit: 2020-02-06 | Discharge: 2020-02-06 | Disposition: A | Discharge status: Home / Self Care | Payer: Medicare PPO | Source: Ambulatory Visit | Attending: Family Medicine | Admitting: Family Medicine

## 2020-02-06 ENCOUNTER — Other Ambulatory Visit: Payer: Self-pay

## 2020-02-06 DIAGNOSIS — Z13820 Encounter for screening for osteoporosis: Secondary | ICD-10-CM | POA: Diagnosis present

## 2020-02-06 DIAGNOSIS — Z780 Asymptomatic menopausal state: Secondary | ICD-10-CM | POA: Insufficient documentation

## 2020-02-18 ENCOUNTER — Other Ambulatory Visit: Payer: Self-pay

## 2020-02-18 ENCOUNTER — Encounter: Payer: Medicare PPO | Admitting: Family Medicine

## 2020-02-18 NOTE — Telephone Encounter (Signed)
Pt had to reschedule appt due to congestion. Phy schedule 12/13 pt will be out of town for a month for the holidays. Needs refill on her clonazePAM (KLONOPIN) 1 MG tablet sent to [place], [place]   Pt call back [phone]

## 2020-02-19 MED ORDER — CLONAZEPAM 0.5 MG PO TABS
ORAL_TABLET | ORAL | 0 refills | Status: DC
Start: 2020-02-19 — End: 2020-03-18

## 2020-02-19 NOTE — Telephone Encounter (Signed)
I usually don't prescribe these medications or klonapin in pt over 68 yrs old.  Able to give her some medication to take prn.  But pt needing to follow up on 03/16/20 to discuss this further. Will be giving a lower dose and pt to take it as needed.   Dr. Lovena Le

## 2020-03-16 ENCOUNTER — Encounter: Payer: Medicare PPO | Admitting: Family Medicine

## 2020-03-16 ENCOUNTER — Telehealth: Payer: Self-pay

## 2020-03-16 NOTE — Telephone Encounter (Signed)
Controlled meds need appt every 3 months.   Pt needs to come in sooner or go to an urgent care when out of town for refill.   Dr. Lovena Le

## 2020-03-16 NOTE — Telephone Encounter (Signed)
sure

## 2020-03-16 NOTE — Telephone Encounter (Signed)
Can pt do a virtual appointment for refills?

## 2020-03-16 NOTE — Telephone Encounter (Signed)
Pt had to cancel Phy she was sick and she is going be out of town till mid January clonazePAM [facility]) 0.5 MG tablet needs a refill   P_t call back [phone]

## 2020-03-16 NOTE — Telephone Encounter (Signed)
Patient scheduled appointment for 04/24/20

## 2020-03-17 NOTE — Telephone Encounter (Signed)
Patient understood and was transferred to the front to schedule an appointment.

## 2020-03-18 ENCOUNTER — Other Ambulatory Visit: Payer: Self-pay

## 2020-03-18 ENCOUNTER — Ambulatory Visit (HOSPITAL_COMMUNITY)
Admission: RE | Admit: 2020-03-18 | Discharge: 2020-03-18 | Disposition: A | Discharge status: Home / Self Care | Payer: Medicare PPO | Source: Ambulatory Visit | Attending: Family Medicine | Admitting: Family Medicine

## 2020-03-18 DIAGNOSIS — Z1231 Encounter for screening mammogram for malignant neoplasm of breast: Secondary | ICD-10-CM | POA: Diagnosis not present

## 2020-03-18 DIAGNOSIS — Z780 Asymptomatic menopausal state: Secondary | ICD-10-CM | POA: Insufficient documentation

## 2020-03-18 MED ORDER — CLONAZEPAM 0.5 MG PO TABS
ORAL_TABLET | ORAL | 1 refills | Status: DC
Start: 2020-03-18 — End: 2020-04-15

## 2020-04-07 ENCOUNTER — Telehealth: Payer: Medicare PPO | Admitting: Family Medicine

## 2020-04-14 DIAGNOSIS — Z131 Encounter for screening for diabetes mellitus: Secondary | ICD-10-CM | POA: Diagnosis not present

## 2020-04-14 DIAGNOSIS — Z79899 Other long term (current) drug therapy: Secondary | ICD-10-CM | POA: Diagnosis not present

## 2020-04-14 DIAGNOSIS — E785 Hyperlipidemia, unspecified: Secondary | ICD-10-CM | POA: Diagnosis not present

## 2020-04-15 ENCOUNTER — Telehealth (INDEPENDENT_AMBULATORY_CARE_PROVIDER_SITE_OTHER): Payer: Medicare PPO | Admitting: Family Medicine

## 2020-04-15 ENCOUNTER — Other Ambulatory Visit: Payer: Self-pay

## 2020-04-15 ENCOUNTER — Telehealth: Payer: Self-pay | Admitting: Family Medicine

## 2020-04-15 ENCOUNTER — Encounter: Payer: Self-pay | Admitting: Family Medicine

## 2020-04-15 VITALS — Ht 61.0 in | Wt 174.0 lb

## 2020-04-15 DIAGNOSIS — E785 Hyperlipidemia, unspecified: Secondary | ICD-10-CM | POA: Diagnosis not present

## 2020-04-15 DIAGNOSIS — E669 Obesity, unspecified: Secondary | ICD-10-CM

## 2020-04-15 DIAGNOSIS — F5101 Primary insomnia: Secondary | ICD-10-CM

## 2020-04-15 DIAGNOSIS — Z79899 Other long term (current) drug therapy: Secondary | ICD-10-CM | POA: Diagnosis not present

## 2020-04-15 DIAGNOSIS — F419 Anxiety disorder, unspecified: Secondary | ICD-10-CM

## 2020-04-15 LAB — COMPREHENSIVE METABOLIC PANEL
ALT: 15 IU/L (ref 0–32)
AST: 19 IU/L (ref 0–40)
Albumin/Globulin Ratio: 1.8 (ref 1.2–2.2)
Albumin: 4.2 g/dL (ref 3.8–4.8)
Alkaline Phosphatase: 88 IU/L (ref 44–121)
BUN/Creatinine Ratio: 13 (ref 12–28)
BUN: 11 mg/dL (ref 8–27)
Bilirubin Total: <0.2 mg/dL (ref 0.0–1.2)
CO2: 29 mmol/L (ref 20–29)
Calcium: 9.2 mg/dL (ref 8.7–10.3)
Chloride: 101 mmol/L (ref 96–106)
Creatinine, Ser: 0.86 mg/dL (ref 0.57–1.00)
GFR calc Af Amer: 80 mL/min/{1.73_m2} (ref >59)
GFR calc non Af Amer: 70 mL/min/{1.73_m2} (ref >59)
Globulin, Total: 2.4 g/dL (ref 1.5–4.5)
Glucose: 113 mg/dL — ABNORMAL HIGH (ref 65–99)
Potassium: 4.6 mmol/L (ref 3.5–5.2)
Sodium: 141 mmol/L (ref 134–144)
Total Protein: 6.6 g/dL (ref 6.0–8.5)

## 2020-04-15 LAB — LIPID PANEL
Chol/HDL Ratio: 3.3 ratio (ref 0.0–4.4)
Cholesterol, Total: 184 mg/dL (ref 100–199)
HDL: 55 mg/dL (ref >39)
LDL Chol Calc (NIH): 100 mg/dL — ABNORMAL HIGH (ref 0–99)
Triglycerides: 171 mg/dL — ABNORMAL HIGH (ref 0–149)
VLDL Cholesterol Cal: 29 mg/dL (ref 5–40)

## 2020-04-15 LAB — CBC WITH DIFFERENTIAL/PLATELET
Basophils Absolute: 0.1 10*3/uL (ref 0.0–0.2)
Basos: 1 %
EOS (ABSOLUTE): 0.3 10*3/uL (ref 0.0–0.4)
Eos: 4 %
Hematocrit: 37.6 % (ref 34.0–46.6)
Hemoglobin: 12.5 g/dL (ref 11.1–15.9)
Immature Grans (Abs): 0 10*3/uL (ref 0.0–0.1)
Immature Granulocytes: 1 %
Lymphocytes Absolute: 1.9 10*3/uL (ref 0.7–3.1)
Lymphs: 28 %
MCH: 31.1 pg (ref 26.6–33.0)
MCHC: 33.2 g/dL (ref 31.5–35.7)
MCV: 94 fL (ref 79–97)
Monocytes Absolute: 0.5 10*3/uL (ref 0.1–0.9)
Monocytes: 8 %
Neutrophils Absolute: 4 10*3/uL (ref 1.4–7.0)
Neutrophils: 58 %
Platelets: 344 10*3/uL (ref 150–450)
RBC: 4.02 x10E6/uL (ref 3.77–5.28)
RDW: 12.3 % (ref 11.7–15.4)
WBC: 6.8 10*3/uL (ref 3.4–10.8)

## 2020-04-15 LAB — HEMOGLOBIN A1C
Est. average glucose Bld gHb Est-mCnc: 111 mg/dL
Hgb A1c MFr Bld: 5.5 % (ref 4.8–5.6)

## 2020-04-15 MED ORDER — CLONAZEPAM 0.5 MG PO TABS: ORAL_TABLET | ORAL | 2 refills | Status: DC

## 2020-04-15 MED ORDER — DESVENLAFAXINE SUCCINATE ER 100 MG PO TB24: 100.0000 mg | ORAL_TABLET | Freq: Every day | ORAL | 1 refills | Status: DC

## 2020-04-15 MED ORDER — ROSUVASTATIN CALCIUM 10 MG PO TABS: 10.0000 mg | ORAL_TABLET | Freq: Every day | ORAL | 1 refills | Status: DC

## 2020-04-15 MED ORDER — DOXEPIN HCL 50 MG PO CAPS: 50.0000 mg | ORAL_CAPSULE | Freq: Every day | ORAL | 1 refills | Status: DC

## 2020-04-15 MED ORDER — SAXENDA 18 MG/3ML ~~LOC~~ SOPN: PEN_INJECTOR | SUBCUTANEOUS | 1 refills | Status: DC

## 2020-04-15 MED ORDER — NOVOFINE PEN NEEDLE 32G X 6 MM MISC: 1 refills | Status: DC

## 2020-04-15 NOTE — Telephone Encounter (Signed)
Ms. Jasmine Patton, Jasmine Patton are scheduled for a virtual visit with your provider today.    Just as we do with appointments in the office, we must obtain your consent to participate.  Your consent will be active for this visit and any virtual visit you may have with one of our providers in the next 365 days.    If you have a MyChart account, I can also send a copy of this consent to you electronically.  All virtual visits are billed to your insurance company just like a traditional visit in the office.  As this is a virtual visit, video technology does not allow for your provider to perform a traditional examination.  This may limit your provider's ability to fully assess your condition.  If your provider identifies any concerns that need to be evaluated in person or the need to arrange testing such as labs, EKG, etc, we will make arrangements to do so.    Although advances in technology are sophisticated, we cannot ensure that it will always work on either your end or our end.  If the connection with a video visit is poor, we may have to switch to a telephone visit.  With either a video or telephone visit, we are not always able to ensure that we have a secure connection.   I need to obtain your verbal consent now.   Are you willing to proceed with your visit today?   Jasmine Patton has provided verbal consent on 04/15/2020 for a virtual visit (video or telephone).   Vicente Males, LPN 2/87/6811  57:26 AM

## 2020-04-15 NOTE — Progress Notes (Signed)
Patient ID: ANALYSE ANGST, female    DOB: Dec 23, 1950, 70 y.o.   MRN: [id number]  Virtual Visit via Telephone Note  I connected with Jasmine Patton on 04/15/20 at  1:50 PM EST by telephone and verified that I am speaking with the correct person using two identifiers.  Location: Patient: home Provider: office   I discussed the limitations, risks, security and privacy concerns of performing an evaluation and management service by telephone and the availability of in person appointments. I also discussed with the patient that there may be a patient responsible charge related to this service. The patient expressed understanding and agreed to proceed.  Chief Complaint  Patient presents with  . Hyperlipidemia   Subjective:    HPI Pt here for med check. Pt taking meds as directed. Doing well on meds. Pt is having a hard time losing weight. Pt reports seeing an ad in a magazine for a weight loss med.  HLD- doing well no new concerns.  Compliant with meds. No chest pain, palpitations, myalgias or joint pains.  Frequent diarrhea- and went to Dr. Laural Golden and do egd and colonoscopy and it improved after, removed adenoma.  Repeating colonoscopy in 5 yrs.   Labs- slight inc in glucose- 113. Cholesterol- normal range. TG 171.  Taking crestor 10mg .  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  Pt with h/o MS and had brain MRI. Seeing neuro.  Hasn't seen them in 2 yrs.  Not on medications for this at this time.  For sleep taking doxepin 50mg  and kloniprin. pristiq 100mg   occ trying not to take it at night, klonapin, and occ taking 1/2 at night. Taking cbd oil under tongue and helping at time.  having hard time getting to sleep.  Staying to sleep well on this regimen.  No grogginess in am.  Has been on klonapin for a while, since 2016.   Pt requesting refill on klonapin, crestor, pristiq [facility].  Concerns of weight  and wanting to see about weight medications.  Pt interested in trying Saxenda.   Weight currently- 174 lbs, ht- 5'1" Wanting some help.  working on her diet, has had trouble over last few years will gain 20 lbs then lose it, but with menopause has been harder to keep weight down. Has not seen dietician.   Medical History Jasmine Patton has a past medical history of ADD (attention deficit disorder), Allergic rhinitis, Anxiety, Depression, GERD (gastroesophageal reflux disease), Hyperlipidemia, Insomnia, MS (multiple sclerosis) ([place]), Osteopenia, Positive H. pylori test, and Reflux.   Outpatient Encounter Medications as of 04/15/2020  Medication Sig  . BIOTIN PO Take 100 mg by mouth daily.   . Calcium Carbonate Antacid (MAALOX) 600 MG chewable tablet Chew 600 mg by mouth at bedtime.  . Cholecalciferol 50 MCG (2000 UT) TABS Take 2,000 Units by mouth daily.   . Cyanocobalamin 3000 MCG CAPS Take 3,000 mcg by mouth daily.   . diphenhydramine-acetaminophen (TYLENOL PM) 25-500 MG TABS tablet Take 1 tablet by mouth daily as needed (Headache/ back pain).  Marland Kitchen esomeprazole (NEXIUM) 40 MG capsule Take 1 capsule (40 mg total) by mouth daily before breakfast.  . Hyoscyamine Sulfate SL (LEVSIN/SL) 0.125 MG SUBL Place 0.125 mg under the tongue 2 (two) times daily as needed (abd cramping, diarrhea).  . Ibuprofen-diphenhydrAMINE HCl (IBUPROFEN PM) 200-25 MG CAPS Take 2 tablets by mouth daily as needed (Headache/back pain).  . Insulin Pen Needle (NOVOFINE PEN NEEDLE) 32G  X 6 MM MISC Use as directed with saxenda medication daily  . Liraglutide -Weight Management (SAXENDA) 18 MG/3ML SOPN Week 1- Take 0.6mg  SQ injection daily for 1 wk. Week 2-  Take 1.2mg  SQ injection daily. Week 3- Take 1.8mg  SQ injection daily. Week 4- Take 2.4mg  SQ injection daily.  . Melatonin 300 MCG TABS Take 300 mcg by mouth at bedtime.  Marland Kitchen tiZANidine (ZANAFLEX) 4 MG tablet Take 1 tablet (4 mg total) by mouth daily as needed for muscle spasms.  .  Wheat Dextrin (BENEFIBER PO) Take 1 tablet by mouth daily.  . [DISCONTINUED] clonazePAM (KLONOPIN) 0.5 MG tablet TAKE 1 tab p.o. for insomnia prn.  . [DISCONTINUED] desvenlafaxine (PRISTIQ) 100 MG 24 hr tablet Take 1 tablet (100 mg total) by mouth daily.  . [DISCONTINUED] doxepin (SINEQUAN) 50 MG capsule Take 1 capsule (50 mg total) by mouth at bedtime.  . [DISCONTINUED] rosuvastatin (CRESTOR) 10 MG tablet Take 1 tablet (10 mg total) by mouth daily.  . clonazePAM (KLONOPIN) 0.5 MG tablet TAKE 1 tab p.o. for insomnia prn.  . desvenlafaxine (PRISTIQ) 100 MG 24 hr tablet Take 1 tablet (100 mg total) by mouth daily.  Marland Kitchen doxepin (SINEQUAN) 50 MG capsule Take 1 capsule (50 mg total) by mouth at bedtime.  . rosuvastatin (CRESTOR) 10 MG tablet Take 1 tablet (10 mg total) by mouth daily.  . [DISCONTINUED] Polyethyl Glycol-Propyl Glycol (SYSTANE) 0.4-0.3 % SOLN Place 1 application into both eyes daily as needed (Dry eye).   No facility-administered encounter medications on file as of 04/15/2020.     Review of Systems  Constitutional: Positive for unexpected weight change (weight gain). Negative for chills and fever.  HENT: Negative for congestion, rhinorrhea and sore throat.   Respiratory: Negative for cough, shortness of breath and wheezing.   Cardiovascular: Negative for chest pain and leg swelling.  Gastrointestinal: Positive for diarrhea. Negative for abdominal pain, nausea and vomiting.  Genitourinary: Negative for dysuria and frequency.  Musculoskeletal: Negative for arthralgias and back pain.  Skin: Negative for rash.  Neurological: Negative for dizziness, weakness and headaches.     Vitals Ht 5\' 1"  (1.549 m)   Wt 174 lb (78.9 kg)   BMI 32.88 kg/m   Objective:   Physical Exam No PE due to phone visit.  Assessment and Plan   1. Obesity, Class I, BMI 30.0-34.9 (see actual BMI)  2. Medication management - desvenlafaxine (PRISTIQ) 100 MG 24 hr tablet; Take 1 tablet (100 mg total)  by mouth daily.  Dispense: 90 tablet; Refill: 1 - rosuvastatin (CRESTOR) 10 MG tablet; Take 1 tablet (10 mg total) by mouth daily.  Dispense: 90 tablet; Refill: 1  3. Anxiety - clonazePAM (KLONOPIN) 0.5 MG tablet; TAKE 1 tab p.o. for insomnia prn.  Dispense: 30 tablet; Refill: 2  4. Primary insomnia - clonazePAM (KLONOPIN) 0.5 MG tablet; TAKE 1 tab p.o. for insomnia prn.  Dispense: 30 tablet; Refill: 2 - doxepin (SINEQUAN) 50 MG capsule; Take 1 capsule (50 mg total) by mouth at bedtime.  Dispense: 90 capsule; Refill: 1  5. Hyperlipidemia, unspecified hyperlipidemia type - rosuvastatin (CRESTOR) 10 MG tablet; Take 1 tablet (10 mg total) by mouth daily.  Dispense: 90 tablet; Refill: 1   Weight loss management/obesity- Has mychart and send schedule on how to use saxenda injection.- sent.  saxenda script dosing-   Week 1 ? 0.6 mg [place] once daily x 7 days Week 2 ? 1.2 mg [place] once daily x 7 days Week 3 ? 1.8 mg [place]  once daily x 7 days Week 4 ? 2.4 mg [place] once daily x 7 days Week 5 ? 3.0 mg [place] once daily x 7 days   F/u 4-5 wks for weight check on saxenda or prn.   Follow Up Instructions:    I discussed the assessment and treatment plan with the patient. The patient was provided an opportunity to ask questions and all were answered. The patient agreed with the plan and demonstrated an understanding of the instructions.   The patient was advised to call back or seek an in-person evaluation if the symptoms worsen or if the condition fails to improve as anticipated.  I provided 13 minutes of non-face-to-face time during this encounter.

## 2020-04-21 ENCOUNTER — Telehealth: Payer: Self-pay | Admitting: *Deleted

## 2020-04-21 NOTE — Telephone Encounter (Signed)
Please defer this to Dr. Lovena Le she will see this tomorrow

## 2020-04-21 NOTE — Telephone Encounter (Signed)
tammy from [facility] calling to see if order for saxenda could be changed. It was sent in for #9 and it comes in boxes of 5 pens. Wants to change order to 69ml if possible.

## 2020-04-21 NOTE — Telephone Encounter (Signed)
Yes, pls change the order to 68ml for the saxenda with 0 refills. Thx. Dr. Lovena Le

## 2020-04-22 NOTE — Telephone Encounter (Signed)
Discussed with pharmacist at [facility]

## 2020-04-24 ENCOUNTER — Encounter: Payer: Medicare PPO | Admitting: Family Medicine

## 2020-05-20 ENCOUNTER — Ambulatory Visit: Payer: Medicare PPO | Admitting: Family Medicine

## 2020-06-05 ENCOUNTER — Ambulatory Visit: Payer: Medicare PPO | Admitting: Family Medicine

## 2020-06-08 ENCOUNTER — Ambulatory Visit (INDEPENDENT_AMBULATORY_CARE_PROVIDER_SITE_OTHER): Payer: Medicare PPO | Admitting: Family Medicine

## 2020-06-08 ENCOUNTER — Encounter: Payer: Self-pay | Admitting: Family Medicine

## 2020-06-08 ENCOUNTER — Other Ambulatory Visit: Payer: Self-pay

## 2020-06-08 VITALS — HR 122 | Temp 98.6°F | Resp 16

## 2020-06-08 DIAGNOSIS — L0100 Impetigo, unspecified: Secondary | ICD-10-CM | POA: Insufficient documentation

## 2020-06-08 DIAGNOSIS — J020 Streptococcal pharyngitis: Secondary | ICD-10-CM

## 2020-06-08 DIAGNOSIS — J029 Acute pharyngitis, unspecified: Secondary | ICD-10-CM | POA: Diagnosis not present

## 2020-06-08 DIAGNOSIS — L089 Local infection of the skin and subcutaneous tissue, unspecified: Secondary | ICD-10-CM | POA: Diagnosis not present

## 2020-06-08 DIAGNOSIS — B9689 Other specified bacterial agents as the cause of diseases classified elsewhere: Secondary | ICD-10-CM | POA: Diagnosis not present

## 2020-06-08 LAB — POCT RAPID STREP A (OFFICE): Rapid Strep A Screen: POSITIVE — AB

## 2020-06-08 MED ORDER — MUPIROCIN 2 % EX OINT: TOPICAL_OINTMENT | Freq: Every day | CUTANEOUS | 0 refills | Status: DC

## 2020-06-08 MED ORDER — PENICILLIN V POTASSIUM 500 MG PO TABS: 500.0000 mg | ORAL_TABLET | Freq: Three times a day (TID) | ORAL | 0 refills | Status: DC

## 2020-06-08 NOTE — Progress Notes (Signed)
Patient presents today with respiratory illness Number of days present- 3days- Thursday night   Symptoms include- swollen sore throat, cough when laying down  Presence of worrisome signs (severe shortness of breath, lethargy, etc.) -none  Recent/current visit to urgent care or ER-none  Recent direct exposure to Covid-no  Any current Covid testing-at home test done last night that was negative.  Pt also having rash in groin area, in crease of legs. Rash is now spreading with red bumps. Vaginal yellow discharge.     Patient ID: Jasmine Patton, female    DOB: April 26, 1950, 70 y.o.   MRN: [id number]   Chief Complaint  Patient presents with  . Sore Throat   Subjective:  CC: sore throat and vaginal bumps  This is a new problem.  Presents today with a complaint of sore throat and vaginal discharge and rash.  Was scheduled to see her PCP, Dr. Lovena Patton last week for her vaginal discharge and rash, and woke up with a sore throat, canceled that appointment.  Presents today for sore throat.  Took a home COVID test which was negative.  Denies fever, chills, positive for fatigue and sore throat and rash in the groin area.    Medical History Jasmine Patton has a past medical history of ADD (attention deficit disorder), Allergic rhinitis, Anxiety, Depression, GERD (gastroesophageal reflux disease), Hyperlipidemia, Insomnia, MS (multiple sclerosis) ([place]), Osteopenia, Positive H. pylori test, and Reflux.   Outpatient Encounter Medications as of 06/08/2020  Medication Sig  . BIOTIN PO Take 100 mg by mouth daily.   . Calcium Carbonate Antacid (MAALOX) 600 MG chewable tablet Chew 600 mg by mouth at bedtime.  . Cholecalciferol 50 MCG (2000 UT) TABS Take 2,000 Units by mouth daily.   . clonazePAM (KLONOPIN) 0.5 MG tablet TAKE 1 tab p.o. for insomnia prn.  . Cyanocobalamin 3000 MCG CAPS Take 3,000 mcg by mouth daily.   Marland Kitchen desvenlafaxine (PRISTIQ) 100 MG 24 hr tablet Take 1 tablet (100 mg total) by mouth daily.   . diphenhydramine-acetaminophen (TYLENOL PM) 25-500 MG TABS tablet Take 1 tablet by mouth daily as needed (Headache/ back pain).  Marland Kitchen doxepin (SINEQUAN) 50 MG capsule Take 1 capsule (50 mg total) by mouth at bedtime.  Marland Kitchen esomeprazole (NEXIUM) 40 MG capsule Take 1 capsule (40 mg total) by mouth daily before breakfast.  . Hyoscyamine Sulfate SL (LEVSIN/SL) 0.125 MG SUBL Place 0.125 mg under the tongue 2 (two) times daily as needed (abd cramping, diarrhea).  . Ibuprofen-diphenhydrAMINE HCl (IBUPROFEN PM) 200-25 MG CAPS Take 2 tablets by mouth daily as needed (Headache/back pain).  . Insulin Pen Needle (NOVOFINE PEN NEEDLE) 32G X 6 MM MISC Use as directed with saxenda medication daily  . Liraglutide -Weight Management (SAXENDA) 18 MG/3ML SOPN Week 1- Take 0.6mg  SQ injection daily for 1 wk. Week 2-  Take 1.2mg  SQ injection daily. Week 3- Take 1.8mg  SQ injection daily. Week 4- Take 2.4mg  SQ injection daily.  . Melatonin 300 MCG TABS Take 300 mcg by mouth at bedtime.  . mupirocin ointment (BACTROBAN) 2 % Apply topically daily. Apply topically to left external ear once per day for 7 days.  . penicillin v potassium (VEETID) 500 MG tablet Take 1 tablet (500 mg total) by mouth 3 (three) times daily.  . rosuvastatin (CRESTOR) 10 MG tablet Take 1 tablet (10 mg total) by mouth daily.  Marland Kitchen tiZANidine (ZANAFLEX) 4 MG tablet Take 1 tablet (4 mg total) by mouth daily as needed for muscle spasms.  . Wheat Dextrin (BENEFIBER  PO) Take 1 tablet by mouth daily.   No facility-administered encounter medications on file as of 06/08/2020.     Review of Systems  Constitutional: Positive for fatigue. Negative for chills and fever.  HENT: Positive for sore throat.   Respiratory: Negative for shortness of breath.   Cardiovascular: Negative for chest pain.  Gastrointestinal: Negative for abdominal pain, nausea and vomiting.  Skin: Positive for rash.       Groin area and upper thigh     Vitals Pulse (!) 122   Temp 98.6 F  (37 C)   Resp 16   SpO2 98%   Objective:   Physical Exam Vitals reviewed.  Constitutional:      Appearance: Normal appearance.  HENT:     Ears:     Comments: Left external ear lobe with crusty scabbed over areas tender to touch    Nose:     Right Sinus: No maxillary sinus tenderness or frontal sinus tenderness.     Left Sinus: No maxillary sinus tenderness or frontal sinus tenderness.     Mouth/Throat:     Pharynx: Oropharyngeal exudate and posterior oropharyngeal erythema present.     Tonsils: 1+ on the right. 1+ on the left.  Cardiovascular:     Rate and Rhythm: Normal rate and regular rhythm.  Pulmonary:     Effort: Pulmonary effort is normal.     Breath sounds: Normal breath sounds.  Skin:    General: Skin is warm and dry.  Neurological:     General: No focal deficit present.     Mental Status: She is alert.  Psychiatric:        Behavior: Behavior normal.     Results for orders placed or performed in visit on 06/08/20  POCT rapid strep A  Result Value Ref Range   Rapid Strep A Screen Positive (A) Negative    Assessment and Plan   1. Sore throat - Novel Coronavirus, NAA ([facility]) - POCT rapid strep A  2. Bacterial skin infection - mupirocin ointment (BACTROBAN) 2 %; Apply topically daily. Apply topically to left external ear once per day for 7 days.  Dispense: 15 g; Refill: 0  3. Streptococcal sore throat - penicillin v potassium (VEETID) 500 MG tablet; Take 1 tablet (500 mg total) by mouth 3 (three) times daily.  Dispense: 21 tablet; Refill: 0    Rapid strep positive, will treat with penicillin 3 times a day for 7 days.  Left ear external lobe with crusty sore, will treat with mupirocin ointment daily.  She will wait to see Dr. Lovena Patton for vaginal infection, when she is feeling better.  Agrees with plan of care discussed today. Understands warning signs to seek further care: chest pain, shortness of breath, any significant change in health.  Understands to  follow-up with PCP, Dr. Lovena Patton, in the next few days for vaginal discharge/rash and left external ear skin infection.  Will notify once COVID results are available.   Chalmers Guest, NP 06/08/2020

## 2020-06-08 NOTE — Patient Instructions (Addendum)
Once you are healed from the sore throat- follow-up with PCP for vaginal infection/groin infection and skin infection Stay hydrated.    Strep Throat, Adult Strep throat is an infection of the throat. It is caused by germs (bacteria). Strep throat is common during the cold months of the year. It mostly affects children who are 7-69 years old. However, people of all ages can get it at any time of the year. When strep throat affects the tonsils, it is called tonsillitis. When it affects the back of the throat, it is called pharyngitis. This infection spreads from person to person through coughing, sneezing, or having close contact. What are the causes? This condition is caused by the Streptococcus pyogenes germ. What increases the risk? You are more likely to develop this condition if:  You care for young children. Children are more likely to get strep throat and may spread it to others.  You go to crowded places. Germs can spread easily in such places.  You kiss or touch someone who has strep throat. What are the signs or symptoms? Symptoms of this condition include:  Fever or chills.  Redness, swelling, or pain in the tonsils or throat.  Pain or trouble when swallowing.  White or yellow spots on the tonsils or throat.  Tender glands in the neck and under the jaw.  Bad breath.  Red rash all over the body. This is rare. How is this treated? This condition may be treated with:  Medicines that kill germs (antibiotics).  Medicines that treat pain or fever. These include: ? Ibuprofen or acetaminophen. ? Aspirin, only for patients who are over the age of 13. ? Throat lozenges. ? Throat sprays. Follow these instructions at home: Medicines  Take over-the-counter and prescription medicines only as told by your doctor.  Take your antibiotic medicine as told by your doctor. Do not stop taking the antibiotic even if you start to feel better.   Eating and drinking  If you have  trouble swallowing, eat soft foods until your throat feels better.  Drink enough fluid to keep your pee (urine) pale yellow.  To help with pain, you may have: ? Warm fluids, such as soup and tea. ? Cold fluids, such as frozen desserts or popsicles.   General instructions  Rinse your mouth (gargle) with a salt-water mixture 3-4 times a day or as needed. To make a salt-water mixture, dissolve -1 tsp (3-6 g) of salt in 1 cup (237 mL) of warm water.  Rest as much as you can.  Stay home from work or school until you have been taking antibiotics for 24 hours.  Avoid smoking or being around people who smoke.  Keep all follow-up visits as told by your doctor. This is important. How is this prevented?  Do not share food, drinking cups, or personal items. They can cause the germs to spread.  Wash your hands well with soap and water. Make sure that all people in your house wash their hands well.  Have family members tested if they have a fever or a sore throat. They may need an antibiotic if they have strep throat.   Contact a doctor if:  You have swelling in your neck that keeps getting bigger.  You get a rash, cough, or earache.  You cough up a thick fluid that is green, yellow-brown, or bloody.  You have pain that does not get better with medicine.  Your symptoms get worse instead of getting better.  You have a  fever. Get help right away if:  You vomit.  You have a very bad headache.  Your neck hurts or feels stiff.  You have chest pain or are short of breath.  You have drooling, very bad throat pain, or changes in your voice.  Your neck is swollen, or the skin gets red and tender.  Your mouth is dry, or you are peeing less than normal.  You keep feeling more tired or have trouble waking up.  Your joints are red or painful. Summary  Strep throat is an infection of the throat. It is caused by germs (bacteria).  This infection can spread from person to person  through coughing, sneezing, or having close contact.  Take your medicines, including antibiotics, as told by your doctor. Do not stop taking the antibiotic even if you start to feel better.  To prevent the spread of germs, wash your hands well with soap and water. Have others do the same. Do not share food, drinking cups, or personal items.  Get help right away if you have a bad headache, chest pain, shortness of breath, a stiff or painful neck, or you vomit. This information is not intended to replace advice given to you by your health care provider. Make sure you discuss any questions you have with your health care provider. Document Revised: 06/08/2018 Document Reviewed: 06/08/2018 Elsevier Patient Education  [place] supportive therapy while you are recovering:   1) Get lots of rest.  2) Take over the counter pain medication if needed, such as acetaminophen or ibuprofen. Read and follow instructions on the label and make sure not to combine other medications that may have same ingredients in it. It is important to not take too much of these ingredients.  3) Drink plenty of caffeine-free fluids. (If you have heart or kidney problems, follow the instructions of your specialist regarding amounts).  4) If you are hungry, eat a bland diet, such as the BRAT diet (bananas, rice, applesauce, toast).  5) Let us know if you are not feeling better in a week.

## 2020-06-09 LAB — NOVEL CORONAVIRUS, NAA: SARS-CoV-2, NAA: NOT DETECTED

## 2020-06-09 LAB — SPECIMEN STATUS REPORT

## 2020-06-09 LAB — SARS-COV-2, NAA 2 DAY TAT

## 2020-06-11 ENCOUNTER — Ambulatory Visit: Payer: Medicare PPO | Admitting: Family Medicine

## 2020-06-11 ENCOUNTER — Encounter: Payer: Self-pay | Admitting: Family Medicine

## 2020-06-11 ENCOUNTER — Other Ambulatory Visit: Payer: Self-pay

## 2020-06-11 VITALS — BP 157/89 | HR 115 | Temp 97.6°F | Wt 177.2 lb

## 2020-06-11 DIAGNOSIS — M62838 Other muscle spasm: Secondary | ICD-10-CM

## 2020-06-11 DIAGNOSIS — Z79899 Other long term (current) drug therapy: Secondary | ICD-10-CM

## 2020-06-11 DIAGNOSIS — B369 Superficial mycosis, unspecified: Secondary | ICD-10-CM | POA: Diagnosis not present

## 2020-06-11 MED ORDER — FLUCONAZOLE 200 MG PO TABS: 200.0000 mg | ORAL_TABLET | Freq: Every day | ORAL | 0 refills | Status: DC

## 2020-06-11 MED ORDER — TIZANIDINE HCL 4 MG PO TABS
4.0000 mg | ORAL_TABLET | Freq: Every day | ORAL | 1 refills | Status: DC | PRN
Start: 2020-06-11 — End: 2020-09-04

## 2020-06-11 NOTE — Progress Notes (Signed)
Pt has rash in pelvic area. Pt has felt moisture/sweating in folds of groin. That would cause dryness and cracking. Has tried OTC meds. Developed little red bumps; have cleared up but still having redness. Does itch. Thick discharge with slight yellow tint to it couple days ago. No discharge today.     Patient ID: Jasmine Patton, female    DOB: 13-Jun-1950, 70 y.o.   MRN: [id number]   Chief Complaint  Patient presents with  . Rash   Subjective:  CC: groin rash  This is a new problem.  Presents today for bilateral groin rash.  Symptoms have been present for 3 weeks.  Describes as red rash with bumps.  Previously had some vaginal discharge, this has resolved.  Has tried over-the-counter fungal topical creams without resolution of problems.  Has not changed detergents, or lotions.  Area does stay moist frequently.  Denies fever, chills, chest pain, shortness of breath, any symptoms with urination or pelvic pain.  Any sexual relationship with 1 partner no changes with sexual habits.    Medical History Jasmine Patton has a past medical history of ADD (attention deficit disorder), Allergic rhinitis, Anxiety, Depression, GERD (gastroesophageal reflux disease), Hyperlipidemia, Insomnia, MS (multiple sclerosis) ([place]), Osteopenia, Positive H. pylori test, and Reflux.   Outpatient Encounter Medications as of 06/11/2020  Medication Sig  . BIOTIN PO Take 100 mg by mouth daily.   . Calcium Carbonate Antacid (MAALOX) 600 MG chewable tablet Chew 600 mg by mouth at bedtime.  . Cholecalciferol 50 MCG (2000 UT) TABS Take 2,000 Units by mouth daily.   . clonazePAM (KLONOPIN) 0.5 MG tablet TAKE 1 tab p.o. for insomnia prn.  . Cyanocobalamin 3000 MCG CAPS Take 3,000 mcg by mouth daily.   Marland Kitchen desvenlafaxine (PRISTIQ) 100 MG 24 hr tablet Take 1 tablet (100 mg total) by mouth daily.  . diphenhydramine-acetaminophen (TYLENOL PM) 25-500 MG TABS tablet Take 1 tablet by mouth daily as needed (Headache/ back pain).  Marland Kitchen doxepin  (SINEQUAN) 50 MG capsule Take 1 capsule (50 mg total) by mouth at bedtime.  Marland Kitchen esomeprazole (NEXIUM) 40 MG capsule Take 1 capsule (40 mg total) by mouth daily before breakfast.  . fluconazole (DIFLUCAN) 200 MG tablet Take 1 tablet (200 mg total) by mouth daily.  Marland Kitchen Hyoscyamine Sulfate SL (LEVSIN/SL) 0.125 MG SUBL Place 0.125 mg under the tongue 2 (two) times daily as needed (abd cramping, diarrhea).  . Ibuprofen-diphenhydrAMINE HCl (IBUPROFEN PM) 200-25 MG CAPS Take 2 tablets by mouth daily as needed (Headache/back pain).  . Insulin Pen Needle (NOVOFINE PEN NEEDLE) 32G X 6 MM MISC Use as directed with saxenda medication daily  . Liraglutide -Weight Management (SAXENDA) 18 MG/3ML SOPN Week 1- Take 0.6mg  SQ injection daily for 1 wk. Week 2-  Take 1.2mg  SQ injection daily. Week 3- Take 1.8mg  SQ injection daily. Week 4- Take 2.4mg  SQ injection daily.  . Melatonin 300 MCG TABS Take 300 mcg by mouth at bedtime.  . mupirocin ointment (BACTROBAN) 2 % Apply topically daily. Apply topically to left external ear once per day for 7 days.  . penicillin v potassium (VEETID) 500 MG tablet Take 1 tablet (500 mg total) by mouth 3 (three) times daily.  . rosuvastatin (CRESTOR) 10 MG tablet Take 1 tablet (10 mg total) by mouth daily.  . Wheat Dextrin (BENEFIBER PO) Take 1 tablet by mouth daily.  . [DISCONTINUED] tiZANidine (ZANAFLEX) 4 MG tablet Take 1 tablet (4 mg total) by mouth daily as needed for muscle spasms.  Marland Kitchen tiZANidine (  ZANAFLEX) 4 MG tablet Take 1 tablet (4 mg total) by mouth daily as needed for muscle spasms.   No facility-administered encounter medications on file as of 06/11/2020.     Review of Systems  Constitutional: Negative for chills and fever.  Respiratory: Negative for shortness of breath.   Cardiovascular: Negative for chest pain.  Gastrointestinal: Negative for abdominal pain.  Genitourinary: Negative for dysuria, pelvic pain, vaginal bleeding and vaginal discharge.  Skin: Positive for  rash.       Bilateral groin moist, raised bumps.      Vitals BP (!) 157/89   Pulse (!) 115   Temp 97.6 F (36.4 C)   Wt 177 lb 3.2 oz (80.4 kg)   SpO2 95%   BMI 33.48 kg/m   Objective:   Physical Exam Vitals reviewed. Exam conducted with a chaperone present.  Constitutional:      Appearance: Normal appearance.  Cardiovascular:     Rate and Rhythm: Normal rate and regular rhythm.     Heart sounds: Normal heart sounds.  Pulmonary:     Effort: Pulmonary effort is normal.     Breath sounds: Normal breath sounds.  Genitourinary:    Labia:        Right: Rash present.        Left: Rash present.      Comments: Bilateral groin erythematous with excoriation and skin integrity broken.   Skin:    General: Skin is warm and dry.  Neurological:     General: No focal deficit present.     Mental Status: She is alert.  Psychiatric:        Behavior: Behavior normal.      Assessment and Plan   1. Fungal skin infection - fluconazole (DIFLUCAN) 200 MG tablet; Take 1 tablet (200 mg total) by mouth daily.  Dispense: 7 tablet; Refill: 0  2. Medication management - tiZANidine (ZANAFLEX) 4 MG tablet; Take 1 tablet (4 mg total) by mouth daily as needed for muscle spasms.  Dispense: 30 tablet; Refill: 1  3. Muscle spasms of both lower extremities - tiZANidine (ZANAFLEX) 4 MG tablet; Take 1 tablet (4 mg total) by mouth daily as needed for muscle spasms.  Dispense: 30 tablet; Refill: 1   Recommend Desitin ointment to bilateral groin as needed and to keep areas dry.  Will take fluconazole once daily for 7 days.  Requested refill on Zanaflex for muscle spasms, refill sent.  Agrees with plan of care discussed today. Understands warning signs to seek further care: chest pain, shortness of breath, any significant change in health.  Understands to follow-up if symptoms do not improve, or worsen.    Chalmers Guest, NP 06/11/2020

## 2020-06-11 NOTE — Patient Instructions (Signed)
Take diflucan once daily for 7 days. Use Desitin butt cream in groin area and try to keep as dry as possible.     Fungal Infections of the Skin  Fungal infections can be spread from person to person through close personal contact.  The infection can be also be transmitted by sharing a towel, comb, or walking barefoot on a comtaminated surface.  Some fungal infections can be acquired from animals or soil. Fungus thrives in warm, moist environments, therefore patients are most at risk for developing infections when their skin stays wet for long periods.  Pool decks, underwear, and shower tiles are common places fungus may grow.  Athlete's Foot (Tinea Pedis) Most people are infected by walking barefoot in a public place such as a locker room.  On the toes, the skin usually peels, itches, and flakes, but sometimes may blister. A dermatologist can help determine whether or not a patient has fungus.  It may look like other skin conditions such as dermatitis or psoriasis.  An antifungal cream often works well to relieve the burning and itching, and to clear the skin in mild cases of fungus.  In more severe cases, an oral anti-fungal pill may be necessary.  You can take some precautions to prevent athlete's foot: 1. Wear sandals or flip-flops during the summer.  If you have to wear boots or shoes, sprinkle anti-fungal powder in them daily. 2. Do not walk barefoot in public places. 3. Avoid wearing other people's shoes. 4. Wash your feet daily. 5. Wear socks that wick away moisture.  Change them every day, and change your socks if they are damp.  Nail Fungus (Onychomycosis) A fungal infection of the nail can cause it to become thick and discolored.  Sometimes the nail crumbles or develops debris underneath.  Nail fungus tends to be more common in people with athlete's foot or who have an injured nail.  To cure this condition it may be necessary to treat with oral anti-fungal medications.  Treatment can be  difficult, and successful treatment does not prevent future nail infections.  Scalp Ringworm (Tinea Capitis) This is most common in children.  It can cause hair loss and flaky scalp.  If treated correctly, the hair will grow back.  It generally requires oral medications as most topical medications do not penetrate to the hair root.  Jock Itch (Tinea Cruris) Jock itch is a fungal infection in the groin area.  It can be treated with anti-fungal creams or pills.  The area should be kept clean and dry.  Anti-fungal powders can prevent it from recurring.  Ringworm (Tinea Corporis) This is the name given to fungal infections elsewhere on the body because it often forms a ring-shaped rash.  Treatment can include anti-fungal cream or pills.

## 2020-07-13 ENCOUNTER — Telehealth: Payer: Self-pay | Admitting: Family Medicine

## 2020-07-13 NOTE — Telephone Encounter (Signed)
Left message for patient to schedule Annual Wellness Visit.  Please schedule with Nurse Health Advisor Shannon Crews, RN at [facility]  

## 2020-08-20 ENCOUNTER — Other Ambulatory Visit: Payer: Self-pay | Admitting: Family Medicine

## 2020-08-20 DIAGNOSIS — F5101 Primary insomnia: Secondary | ICD-10-CM

## 2020-08-20 DIAGNOSIS — F419 Anxiety disorder, unspecified: Secondary | ICD-10-CM

## 2020-08-20 NOTE — Telephone Encounter (Signed)
04/15/20 was last med check up

## 2020-08-28 ENCOUNTER — Encounter: Payer: Self-pay | Admitting: Nurse Practitioner

## 2020-08-28 ENCOUNTER — Ambulatory Visit: Payer: Medicare PPO | Admitting: Nurse Practitioner

## 2020-08-28 ENCOUNTER — Other Ambulatory Visit: Payer: Self-pay

## 2020-08-28 VITALS — BP 140/83 | HR 93 | Temp 98.1°F | Ht 61.0 in | Wt 177.0 lb

## 2020-08-28 DIAGNOSIS — F419 Anxiety disorder, unspecified: Secondary | ICD-10-CM | POA: Diagnosis not present

## 2020-08-28 DIAGNOSIS — E785 Hyperlipidemia, unspecified: Secondary | ICD-10-CM

## 2020-08-28 DIAGNOSIS — Z79899 Other long term (current) drug therapy: Secondary | ICD-10-CM

## 2020-08-28 DIAGNOSIS — R5382 Chronic fatigue, unspecified: Secondary | ICD-10-CM

## 2020-08-28 DIAGNOSIS — F3289 Other specified depressive episodes: Secondary | ICD-10-CM | POA: Diagnosis not present

## 2020-08-28 NOTE — Progress Notes (Signed)
   Subjective:    Patient ID: Jasmine Patton, female    DOB: May 03, 1950, 70 y.o.   MRN: [id number]  HPImed check up.   Diarrhea off and on for past 6 months. Tried keto diet for about one week and had trouble with diarrhea since then. Has it about once a week. Saw Dr. Laural Golden and had colonoscopy and taking hyoscyamine prn. Pt states she knows its something with diet and has noticed it with dark chocolate.  Questions whether she has food allergies/intolerance.  States she was told by Dr. 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 Anxiety and depression has been fairly stable on Pristiq.  Struggling some at this time with fatigue, relates all of this to her MS.  But this medication has seemed to work better for her than others we have tried in the past.  Takes an occasional Klonopin along with nightly doxepin for sleep. Was prescribed Saxenda by Dr. Lovena Le at her last visit, has not started this due to concerns about diarrhea. Depression screen [facility] 2/9 08/28/2020 04/15/2020 01/22/2020 04/25/2018 11/22/2017  Decreased Interest 2 0 1 0 1  Down, Depressed, Hopeless 1 0 1 0 0  PHQ - 2 Score 3 0 2 0 1  Altered sleeping 1 - 2 - 1  Tired, decreased energy 2 - 2 - 2  Change in appetite 0 - 1 - 1  Feeling bad or failure about yourself  0 - 0 - 0  Trouble concentrating 1 - 1 - 1  Moving slowly or fidgety/restless 0 - 1 - 1  Suicidal thoughts 0 - 0 - 0  PHQ-9 Score 7 - 9 - 7  Difficult doing work/chores Somewhat difficult - Somewhat difficult - Somewhat difficult          Objective:   Physical Exam NAD.  Alert, oriented.  Cheerful, mildly anxious affect.  Making good eye contact.  Dressed appropriately.  Speech clear.  Thoughts logical coherent and relevant.  Lungs clear.  Heart regular rate and rhythm. Today's Vitals   08/28/20 1500  BP: 140/83  Pulse: 93  Temp: 98.1 F (36.7 C)  Weight: 177 lb (80.3 kg)  Height: 5\' 1"  (1.549 m)   Body mass index is 33.44 kg/m.        Assessment & Plan:    Problem List Items Addressed This Visit      Other   Anxiety - Primary   Depression   Fatigue   Relevant Orders   TSH   Hyperlipidemia   Relevant Orders   Lipid panel    Other Visit Diagnoses    High risk medication use       Relevant Orders   Hepatic function panel      Continue current regimen as directed.  Routine labs ordered including TSH. Strongly recommend wellness exam in the near future. Activity as tolerated with her MS.  Recommend she start Saxenda at lowest dose and slowly titrate as needed or tolerated.  DC medication if any significant adverse effects. Patient is keeping a food diary for Dr. Laural Golden to see if there are any triggers for her chronic diarrhea. Return in about 6 months (around 02/28/2021) for Recommend wellness exam soon.

## 2020-08-30 ENCOUNTER — Encounter: Payer: Self-pay | Admitting: Nurse Practitioner

## 2020-09-01 ENCOUNTER — Telehealth: Payer: Self-pay

## 2020-09-01 ENCOUNTER — Encounter: Payer: Self-pay | Admitting: Nurse Practitioner

## 2020-09-01 DIAGNOSIS — F5101 Primary insomnia: Secondary | ICD-10-CM

## 2020-09-01 DIAGNOSIS — F419 Anxiety disorder, unspecified: Secondary | ICD-10-CM

## 2020-09-01 MED ORDER — CLONAZEPAM 0.5 MG PO TABS: ORAL_TABLET | ORAL | 0 refills | Status: DC

## 2020-09-01 NOTE — Telephone Encounter (Signed)
Patient saw [name] on Friday and said she was suppose to call in a refill on her Clonazepam to [facility] but they never got a rx.

## 2020-09-04 ENCOUNTER — Other Ambulatory Visit: Payer: Self-pay | Admitting: Nurse Practitioner

## 2020-09-04 DIAGNOSIS — Z79899 Other long term (current) drug therapy: Secondary | ICD-10-CM

## 2020-09-04 DIAGNOSIS — M62838 Other muscle spasm: Secondary | ICD-10-CM

## 2020-09-04 DIAGNOSIS — F5101 Primary insomnia: Secondary | ICD-10-CM

## 2020-09-04 DIAGNOSIS — E785 Hyperlipidemia, unspecified: Secondary | ICD-10-CM

## 2020-09-04 MED ORDER — TIZANIDINE HCL 4 MG PO TABS: 4.0000 mg | ORAL_TABLET | Freq: Every day | ORAL | 1 refills | Status: DC | PRN

## 2020-09-04 MED ORDER — DOXEPIN HCL 50 MG PO CAPS: 50.0000 mg | ORAL_CAPSULE | Freq: Every day | ORAL | 1 refills | Status: DC

## 2020-09-04 MED ORDER — ROSUVASTATIN CALCIUM 10 MG PO TABS: 10.0000 mg | ORAL_TABLET | Freq: Every day | ORAL | 1 refills | Status: DC

## 2020-09-18 ENCOUNTER — Telehealth: Payer: Self-pay

## 2020-09-18 ENCOUNTER — Other Ambulatory Visit: Payer: Self-pay | Admitting: Family Medicine

## 2020-09-18 DIAGNOSIS — B369 Superficial mycosis, unspecified: Secondary | ICD-10-CM

## 2020-09-18 MED ORDER — FLUCONAZOLE 200 MG PO TABS: 200.0000 mg | ORAL_TABLET | Freq: Every day | ORAL | 0 refills | Status: DC

## 2020-09-18 NOTE — Telephone Encounter (Signed)
Pt has Fungal infection come back in her growing area and wants to know if Diflucan she seen Santiago Glad on March 10 th for same thing its more like a rash in pelvic area. [place]   Pt call back [phone]

## 2020-09-18 NOTE — Telephone Encounter (Signed)
1 refill of Diflucan was sent to the pharmacy regarding this-if ongoing troubles recommend follow-up office visit

## 2020-09-18 NOTE — Telephone Encounter (Signed)
Patient informed her refill was sent in to pharmacy .

## 2020-09-18 NOTE — Telephone Encounter (Signed)
Seen 3/10 for rash in folds of skin in groin area. Cleared up with treatment karen gave her diflucan 200mg  one daily for 7 days but now has come back in same area.

## 2020-09-21 DIAGNOSIS — E785 Hyperlipidemia, unspecified: Secondary | ICD-10-CM | POA: Diagnosis not present

## 2020-09-21 DIAGNOSIS — R5382 Chronic fatigue, unspecified: Secondary | ICD-10-CM | POA: Diagnosis not present

## 2020-09-21 DIAGNOSIS — Z79899 Other long term (current) drug therapy: Secondary | ICD-10-CM | POA: Diagnosis not present

## 2020-09-22 LAB — LIPID PANEL
Chol/HDL Ratio: 3.6 ratio (ref 0.0–4.4)
Cholesterol, Total: 200 mg/dL — ABNORMAL HIGH (ref 100–199)
HDL: 56 mg/dL (ref >39)
LDL Chol Calc (NIH): 100 mg/dL — ABNORMAL HIGH (ref 0–99)
Triglycerides: 263 mg/dL — ABNORMAL HIGH (ref 0–149)
VLDL Cholesterol Cal: 44 mg/dL — ABNORMAL HIGH (ref 5–40)

## 2020-09-22 LAB — HEPATIC FUNCTION PANEL
ALT: 21 IU/L (ref 0–32)
AST: 23 IU/L (ref 0–40)
Albumin: 4.6 g/dL (ref 3.8–4.8)
Alkaline Phosphatase: 89 IU/L (ref 44–121)
Bilirubin Total: 0.2 mg/dL (ref 0.0–1.2)
Bilirubin, Direct: <0.1 mg/dL (ref 0.00–0.40)
Total Protein: 7 g/dL (ref 6.0–8.5)

## 2020-09-22 LAB — TSH: TSH: 2.13 u[IU]/mL (ref 0.450–4.500)

## 2020-09-26 ENCOUNTER — Encounter: Payer: Self-pay | Admitting: Nurse Practitioner

## 2020-09-28 ENCOUNTER — Other Ambulatory Visit: Payer: Self-pay | Admitting: Family Medicine

## 2020-09-28 DIAGNOSIS — F419 Anxiety disorder, unspecified: Secondary | ICD-10-CM

## 2020-09-28 DIAGNOSIS — F5101 Primary insomnia: Secondary | ICD-10-CM

## 2020-09-30 ENCOUNTER — Other Ambulatory Visit: Payer: Self-pay

## 2020-09-30 DIAGNOSIS — F419 Anxiety disorder, unspecified: Secondary | ICD-10-CM

## 2020-09-30 DIAGNOSIS — F5101 Primary insomnia: Secondary | ICD-10-CM

## 2020-09-30 MED ORDER — CLONAZEPAM 0.5 MG PO TABS: ORAL_TABLET | ORAL | 0 refills | Status: DC

## 2020-10-01 ENCOUNTER — Encounter: Payer: Self-pay | Admitting: Nurse Practitioner

## 2020-10-01 NOTE — Telephone Encounter (Signed)
See other mychart message from today.

## 2020-10-01 NOTE — Progress Notes (Signed)
See my chart messages with patient.

## 2020-10-02 ENCOUNTER — Other Ambulatory Visit: Payer: Self-pay | Admitting: Nurse Practitioner

## 2020-10-02 DIAGNOSIS — Z79899 Other long term (current) drug therapy: Secondary | ICD-10-CM

## 2020-10-02 DIAGNOSIS — E785 Hyperlipidemia, unspecified: Secondary | ICD-10-CM

## 2020-10-02 MED ORDER — ROSUVASTATIN CALCIUM 20 MG PO TABS: 20.0000 mg | ORAL_TABLET | Freq: Every day | ORAL | 0 refills | Status: DC

## 2020-10-15 ENCOUNTER — Other Ambulatory Visit (INDEPENDENT_AMBULATORY_CARE_PROVIDER_SITE_OTHER): Payer: Self-pay | Admitting: Internal Medicine

## 2020-10-26 ENCOUNTER — Other Ambulatory Visit: Payer: Self-pay | Admitting: Nurse Practitioner

## 2020-10-26 MED ORDER — ROSUVASTATIN CALCIUM 20 MG PO TABS: 20.0000 mg | ORAL_TABLET | Freq: Every day | ORAL | 0 refills | Status: DC

## 2020-12-11 ENCOUNTER — Ambulatory Visit: Payer: Medicare PPO | Admitting: Nurse Practitioner

## 2020-12-11 ENCOUNTER — Other Ambulatory Visit: Payer: Self-pay

## 2020-12-11 VITALS — BP 137/73 | Ht 61.0 in | Wt 179.0 lb

## 2020-12-11 DIAGNOSIS — B369 Superficial mycosis, unspecified: Secondary | ICD-10-CM | POA: Diagnosis not present

## 2020-12-11 DIAGNOSIS — F419 Anxiety disorder, unspecified: Secondary | ICD-10-CM | POA: Diagnosis not present

## 2020-12-11 DIAGNOSIS — Z79899 Other long term (current) drug therapy: Secondary | ICD-10-CM | POA: Diagnosis not present

## 2020-12-11 DIAGNOSIS — F5101 Primary insomnia: Secondary | ICD-10-CM | POA: Diagnosis not present

## 2020-12-11 DIAGNOSIS — E785 Hyperlipidemia, unspecified: Secondary | ICD-10-CM | POA: Diagnosis not present

## 2020-12-11 MED ORDER — FLUCONAZOLE 200 MG PO TABS: 200.0000 mg | ORAL_TABLET | Freq: Every day | ORAL | 0 refills | Status: DC

## 2020-12-11 MED ORDER — ESOMEPRAZOLE MAGNESIUM 40 MG PO CPDR: DELAYED_RELEASE_CAPSULE | ORAL | 0 refills | Status: DC

## 2020-12-11 MED ORDER — CLONAZEPAM 0.5 MG PO TABS: ORAL_TABLET | ORAL | 2 refills | Status: DC

## 2020-12-11 MED ORDER — CLOTRIMAZOLE-BETAMETHASONE 1-0.05 % EX CREA: 1.0000 "application " | TOPICAL_CREAM | Freq: Two times a day (BID) | CUTANEOUS | 0 refills | Status: DC

## 2020-12-11 MED ORDER — HYOSCYAMINE SULFATE SL 0.125 MG SL SUBL: 0.1250 mg | SUBLINGUAL_TABLET | Freq: Two times a day (BID) | SUBLINGUAL | 3 refills | Status: DC | PRN

## 2020-12-11 NOTE — Progress Notes (Signed)
   Subjective:    Patient ID: Jasmine Patton, female    DOB: 09-Apr-1950, 70 y.o.   MRN: [id number]  HPI Patient arrives with wet itchy rash under breast and in groin area- feels like it is a yeast rash.   Review of Systems     Objective:   Physical Exam        Assessment & Plan:

## 2020-12-11 NOTE — Progress Notes (Signed)
Subjective:    Patient ID: Jasmine Patton, female    DOB: Oct 26, 1950, 70 y.o.   MRN: [id number]  HPI Patient presents for yeast infection under breast and in folds under abdomen and intertriginous area. States this has been going on since past year on and off but has been every day occurrence since this summer with heat and sweating. Takes frequent baths and uses destin powder. Most recently treated on 6/17. States she was given 7 days of Fluconazole. Rash improved but did not resolve. No history of diabetes or hyperglycemia.    Review of Systems  Constitutional:  Negative for chills, fatigue and fever.  Respiratory:  Negative for cough, choking, chest tightness, shortness of breath and wheezing.   Cardiovascular:  Negative for chest pain.  Skin:  Positive for rash (Under breast in bilaterally and in pelvic and abdominal folds).      Objective:   Physical Exam Constitutional:      Appearance: Normal appearance.  Neck:     Comments: Thyroid non-tender with no masses or nodules noted on palpated  Cardiovascular:     Rate and Rhythm: Normal rate and regular rhythm.     Heart sounds: Normal heart sounds. No murmur heard. Pulmonary:     Effort: Pulmonary effort is normal.     Breath sounds: Normal breath sounds.  Musculoskeletal:     Cervical back: Neck supple.  Skin:    Findings: Erythema and rash present.     Comments: Large moderately erythematous well defined confluent patches noted under bilateral breast areas and along the lower abdominal fold and in the intertriginous bilaterally. Moist with areas of dry flaky skin and several small fissures noted.   Neurological:     Mental Status: She is alert.   Last A1C 5.5 on 04/14/20 .Marland Kitchen Vitals:   12/11/20 1114  BP: 137/73  Height: '5\' 1"'$  (1.549 m)  Weight: 179 lb (81.2 kg)  BMI (Calculated): 33.84         Assessment & Plan:   Problem List Items Addressed This Visit       Musculoskeletal and Integument   Fungal skin  infection - Primary   Relevant Medications   fluconazole (DIFLUCAN) 200 MG tablet   clotrimazole-betamethasone (LOTRISONE) cream     Other   Anxiety   Relevant Medications   clonazePAM (KLONOPIN) 0.5 MG tablet   Insomnia   Relevant Medications   clonazePAM (KLONOPIN) 0.5 MG tablet   Meds ordered this encounter  Medications   clonazePAM (KLONOPIN) 0.5 MG tablet    Sig: TAKE ONE (1) TABLET BY MOUTH EVERY DAY AS NEEDED FOR INSOMNIA    Dispense:  30 tablet    Refill:  2    May refill monthly    Order Specific Question:   Supervising Provider    Answer:   Sallee Lange A [[id number]]   esomeprazole (NEXIUM) 40 MG capsule    Sig: TAKE ONE CAPSULE ('40MG'$  TOTAL) BY MOUTH DAILY BEFORE BREAKFAST    Dispense:  90 capsule    Refill:  0    PATIENT WOULD LIKE A 90 DAY SUPPLY    Order Specific Question:   Supervising Provider    Answer:   Sallee Lange A [[id number]]   Hyoscyamine Sulfate SL (LEVSIN/SL) 0.125 MG SUBL    Sig: Place 0.125 mg under the tongue 2 (two) times daily as needed (abd cramping, diarrhea).    Dispense:  60 tablet    Refill:  3    Order Specific Question:  Supervising Provider    Answer:   Sallee Lange A [[id number]]   fluconazole (DIFLUCAN) 200 MG tablet    Sig: Take 1 tablet (200 mg total) by mouth daily.    Dispense:  14 tablet    Refill:  0    Order Specific Question:   Supervising Provider    Answer:   Sallee Lange A [[id number]]   clotrimazole-betamethasone (LOTRISONE) cream    Sig: Apply 1 application topically 2 (two) times daily. PRN rash up to two weeks    Dispense:  45 g    Refill:  0    Order Specific Question:   Supervising Provider    Answer:   Sallee Lange A [[id number]]   Refills given on medications to make sure she is covered during office transition.  Start oral fluconazole and topical medicine to rash. Reviewed proper skin care. Call back in 7-10 days if no significant improvement, sooner if needed.

## 2020-12-12 ENCOUNTER — Encounter: Payer: Self-pay | Admitting: Nurse Practitioner

## 2020-12-12 LAB — HEPATIC FUNCTION PANEL
ALT: 16 IU/L (ref 0–32)
AST: 20 IU/L (ref 0–40)
Albumin: 4.6 g/dL (ref 3.8–4.8)
Alkaline Phosphatase: 87 IU/L (ref 44–121)
Bilirubin Total: 0.3 mg/dL (ref 0.0–1.2)
Bilirubin, Direct: 0.11 mg/dL (ref 0.00–0.40)
Total Protein: 7.1 g/dL (ref 6.0–8.5)

## 2020-12-12 LAB — LIPID PANEL
Chol/HDL Ratio: 2.8 ratio (ref 0.0–4.4)
Cholesterol, Total: 148 mg/dL (ref 100–199)
HDL: 52 mg/dL (ref >39)
LDL Chol Calc (NIH): 72 mg/dL (ref 0–99)
Triglycerides: 136 mg/dL (ref 0–149)
VLDL Cholesterol Cal: 24 mg/dL (ref 5–40)

## 2020-12-25 ENCOUNTER — Encounter: Payer: Self-pay | Admitting: Nurse Practitioner

## 2020-12-25 ENCOUNTER — Other Ambulatory Visit: Payer: Self-pay | Admitting: Nurse Practitioner

## 2020-12-25 DIAGNOSIS — F5101 Primary insomnia: Secondary | ICD-10-CM

## 2020-12-25 MED ORDER — DOXEPIN HCL 50 MG PO CAPS: 50.0000 mg | ORAL_CAPSULE | Freq: Every day | ORAL | 1 refills | Status: DC

## 2021-03-06 ENCOUNTER — Other Ambulatory Visit: Payer: Self-pay | Admitting: Family Medicine

## 2021-03-06 DIAGNOSIS — Z79899 Other long term (current) drug therapy: Secondary | ICD-10-CM

## 2021-03-09 ENCOUNTER — Other Ambulatory Visit: Payer: Self-pay

## 2021-03-09 ENCOUNTER — Encounter: Payer: Self-pay | Admitting: Family Medicine

## 2021-03-09 ENCOUNTER — Ambulatory Visit: Payer: Medicare PPO | Admitting: Family Medicine

## 2021-03-09 VITALS — BP 124/82 | HR 91 | Temp 97.7°F | Ht 61.0 in | Wt 177.6 lb

## 2021-03-09 DIAGNOSIS — F32A Depression, unspecified: Secondary | ICD-10-CM

## 2021-03-09 DIAGNOSIS — R197 Diarrhea, unspecified: Secondary | ICD-10-CM

## 2021-03-09 DIAGNOSIS — F419 Anxiety disorder, unspecified: Secondary | ICD-10-CM | POA: Diagnosis not present

## 2021-03-09 DIAGNOSIS — G35 Multiple sclerosis: Secondary | ICD-10-CM

## 2021-03-09 DIAGNOSIS — G35D Multiple sclerosis, unspecified: Secondary | ICD-10-CM

## 2021-03-09 MED ORDER — DIPHENOXYLATE-ATROPINE 2.5-0.025 MG PO TABS: 1.0000 | ORAL_TABLET | Freq: Four times a day (QID) | ORAL | 1 refills | Status: DC | PRN

## 2021-03-09 MED ORDER — DESVENLAFAXINE SUCCINATE ER 100 MG PO TB24: 100.0000 mg | ORAL_TABLET | Freq: Every day | ORAL | 1 refills | Status: DC

## 2021-03-09 NOTE — Assessment & Plan Note (Signed)
Stable.  Continue Pristiq.

## 2021-03-09 NOTE — Assessment & Plan Note (Signed)
Patient endorsing chronic diarrhea.  Has some intermittent constipation as well.  I suspect this is IBS. Advised use of Imodium as needed.  If this does not work, she can use the Lomotil that was prescribed. Suspect that this is not due to gluten or food allergy.  Advised low FODMAP diet. Referring back to GI.

## 2021-03-09 NOTE — Patient Instructions (Signed)
Try immodium first. If you continue to have issues with diarrhea use the Lomotil.  I will place a referral to GI.  See Neurology when you can. I would prefer that he see you again before we talk about stimulants.  Follow up early next year.  Take care  Dr. Lacinda Axon

## 2021-03-09 NOTE — Progress Notes (Signed)
Subjective:  Patient ID: Jasmine Patton, female    DOB: [birth year]  Age: 70 y.o. MRN: [id number]  CC: Chief Complaint  Patient presents with   Diarrhea    Patient states diarrhea is on going and would like to discuss food allergies vs IBS. Needs refill on medications    HPI:  70 year old female with MS not on pharmacotherapy at this time, anxiety, depression, insomnia, hyperlipidemia, GERD presents for evaluation the above.  Diarrhea Patient states that this is a chronic problem.  Has been going on for the couple of years. Seems to have alternating constipation and diarrhea but more so diarrhea as of late.  She states that she has very loose stool 1 to 2 days a week.  Associated abdominal cramping.  She has tried to make dietary changes without resolution.  Has previously seen GI. Patient is concerned that she may have a gluten sensitivity and/or food allergies.  She would like to discuss this today.  Multiple sclerosis No pharmacotherapy at this time.  Was last seen by her neurologist in 2019. Patient is inquiring about restarting Adderall which was previously prescribed for "fatigue associated with MS".  Anxiety and depression Stable on Pristiq and clonazepam.  Needs refill.  Patient Active Problem List   Diagnosis Date Noted   Diarrhea 03/09/2021   Tubular adenoma of colon 01/14/2020   Hyperlipidemia 12/30/2016   Gastroesophageal reflux disease without esophagitis 07/13/2016   Family hx of colon cancer 07/13/2016   Insomnia 02/09/2014   Anxiety and depression 05/05/2009   Multiple sclerosis ([place]) 05/05/2009    Social Hx   Social History   Socioeconomic History   Marital status: Married    Spouse name: Not on file   Number of children: Not on file   Years of education: Not on file   Highest education level: Not on file  Occupational History   Not on file  Tobacco Use   Smoking status: Never   Smokeless tobacco: Never  Vaping Use   Vaping Use: Never used   Substance and Sexual Activity   Alcohol use: Yes    Comment: Socially   Drug use: No   Sexual activity: Not on file  Other Topics Concern   Not on file  Social History Narrative   Not on file   Social Determinants of Health   Financial Resource Strain: Not on file  Food Insecurity: Not on file  Transportation Needs: Not on file  Physical Activity: Not on file  Stress: Not on file  Social Connections: Not on file    Review of Systems Per HPI  Objective:  BP 124/82   Pulse 91   Temp 97.7 F (36.5 C) (Oral)   Ht 5\' 1"  (1.549 m)   Wt 177 lb 9.6 oz (80.6 kg)   SpO2 97%   BMI 33.56 kg/m   BP/Weight 03/09/2021 12/11/2020 09/18/735  Systolic BP 106 269 485  Diastolic BP 82 73 83  Wt. (Lbs) 177.6 179 177  BMI 33.56 33.82 33.44    Physical Exam Vitals and nursing note reviewed.  Constitutional:      General: She is not in acute distress.    Appearance: Normal appearance. She is obese. She is not ill-appearing.  HENT:     Head: Normocephalic and atraumatic.  Cardiovascular:     Rate and Rhythm: Normal rate and regular rhythm.  Pulmonary:     Effort: Pulmonary effort is normal.     Breath sounds: Normal breath sounds. No wheezing or  rales.  Abdominal:     General: There is no distension.     Palpations: Abdomen is soft.     Tenderness: There is no abdominal tenderness.  Neurological:     Mental Status: She is alert.  Psychiatric:        Mood and Affect: Mood normal.        Behavior: Behavior normal.    Lab Results  Component Value Date   WBC 6.8 04/14/2020   HGB 12.5 04/14/2020   HCT 37.6 04/14/2020   PLT 344 04/14/2020   GLUCOSE 113 (H) 04/14/2020   CHOL 148 12/11/2020   TRIG 136 12/11/2020   HDL 52 12/11/2020   LDLCALC 72 12/11/2020   ALT 16 12/11/2020   AST 20 12/11/2020   NA 141 04/14/2020   K 4.6 04/14/2020   CL 101 04/14/2020   CREATININE 0.86 04/14/2020   BUN 11 04/14/2020   CO2 29 04/14/2020   TSH 2.130 09/21/2020   HGBA1C 5.5 04/14/2020      Assessment & Plan:   Problem List Items Addressed This Visit       Nervous and Auditory   Multiple sclerosis ([place])    Has been stable.  Advised to follow-up closely with her neurologist.  Regarding her request for Adderall, I have advised her to see her neurologist for this.  Can consider medication in the future but I am not sure that the benefit outweighs the risk. Data from trials does not support the efficacy of medications like Adderall, modafinil, etc.        Other   Anxiety and depression    Stable.  Continue Pristiq.      Relevant Medications   desvenlafaxine (PRISTIQ) 100 MG 24 hr tablet   Diarrhea - Primary    Patient endorsing chronic diarrhea.  Has some intermittent constipation as well.  I suspect this is IBS. Advised use of Imodium as needed.  If this does not work, she can use the Lomotil that was prescribed. Suspect that this is not due to gluten or food allergy.  Advised low FODMAP diet. Referring back to GI.       Meds ordered this encounter  Medications   diphenoxylate-atropine (LOMOTIL) 2.5-0.025 MG tablet    Sig: Take 1 tablet by mouth 4 (four) times daily as needed for diarrhea or loose stools.    Dispense:  30 tablet    Refill:  1   desvenlafaxine (PRISTIQ) 100 MG 24 hr tablet    Sig: Take 1 tablet (100 mg total) by mouth daily.    Dispense:  90 tablet    Refill:  1    Follow-up:  Next year  [place]

## 2021-03-09 NOTE — Telephone Encounter (Signed)
Pt called into office; refill sent to pharmacy

## 2021-03-09 NOTE — Assessment & Plan Note (Signed)
Has been stable.  Advised to follow-up closely with her neurologist.  Regarding her request for Adderall, I have advised her to see her neurologist for this.  Can consider medication in the future but I am not sure that the benefit outweighs the risk. Data from trials does not support the efficacy of medications like Adderall, modafinil, etc.

## 2021-03-31 ENCOUNTER — Telehealth: Payer: Self-pay | Admitting: Family Medicine

## 2021-03-31 NOTE — Telephone Encounter (Signed)
°  Left message for patient to call back and schedule Medicare Annual Wellness Visit (AWV) in office.   If unable to come into the office for AWV,  please offer to do virtually or by telephone.  No hx of AWV eligible for AWVI as of 04/04/2009  Please schedule at anytime with [facility] Advisor.      40 Minutes appointment   Any questions, please call me at [phone]

## 2021-04-29 ENCOUNTER — Other Ambulatory Visit: Payer: Self-pay | Admitting: Nurse Practitioner

## 2021-05-15 ENCOUNTER — Other Ambulatory Visit: Payer: Self-pay | Admitting: Nurse Practitioner

## 2021-05-15 DIAGNOSIS — F5101 Primary insomnia: Secondary | ICD-10-CM

## 2021-05-15 DIAGNOSIS — F419 Anxiety disorder, unspecified: Secondary | ICD-10-CM

## 2021-05-21 ENCOUNTER — Ambulatory Visit (INDEPENDENT_AMBULATORY_CARE_PROVIDER_SITE_OTHER): Payer: Medicare PPO | Admitting: Nurse Practitioner

## 2021-05-21 DIAGNOSIS — F5101 Primary insomnia: Secondary | ICD-10-CM | POA: Diagnosis not present

## 2021-05-21 DIAGNOSIS — F32A Depression, unspecified: Secondary | ICD-10-CM | POA: Diagnosis not present

## 2021-05-21 DIAGNOSIS — M62838 Other muscle spasm: Secondary | ICD-10-CM

## 2021-05-21 DIAGNOSIS — F419 Anxiety disorder, unspecified: Secondary | ICD-10-CM | POA: Diagnosis not present

## 2021-05-21 DIAGNOSIS — B369 Superficial mycosis, unspecified: Secondary | ICD-10-CM | POA: Diagnosis not present

## 2021-05-21 MED ORDER — ROSUVASTATIN CALCIUM 20 MG PO TABS: ORAL_TABLET | ORAL | 1 refills | Status: DC

## 2021-05-21 MED ORDER — DOXEPIN HCL 50 MG PO CAPS: 50.0000 mg | ORAL_CAPSULE | Freq: Every day | ORAL | 1 refills | Status: DC

## 2021-05-21 MED ORDER — KETOCONAZOLE 2 % EX CREA: 1.0000 "application " | TOPICAL_CREAM | Freq: Two times a day (BID) | CUTANEOUS | 0 refills | Status: DC

## 2021-05-21 MED ORDER — TIZANIDINE HCL 4 MG PO TABS: 4.0000 mg | ORAL_TABLET | Freq: Every day | ORAL | 1 refills | Status: DC | PRN

## 2021-05-21 MED ORDER — ESOMEPRAZOLE MAGNESIUM 40 MG PO CPDR: DELAYED_RELEASE_CAPSULE | ORAL | 1 refills | Status: DC

## 2021-05-21 MED ORDER — CLONAZEPAM 0.5 MG PO TABS: ORAL_TABLET | ORAL | 2 refills | Status: DC

## 2021-05-21 NOTE — Progress Notes (Signed)
Patient ID: Jasmine Patton, female   DOB: 1950/05/25, 71 y.o.   MRN: [id number] Virtual Visit via Telephone Note  I connected with Jasmine Patton on 05/21/21 at 11:20 AM EST by telephone and verified that I am speaking with the correct person using two identifiers.  Location: Patient: home Provider: office   I discussed the limitations, risks, security and privacy concerns of performing an evaluation and management service by telephone and the availability of in person appointments. I also discussed with the patient that there may be a patient responsible charge related to this service. The patient expressed understanding and agreed to proceed.   History of Present Illness: Presents by phone to discuss recurrent yeast infections in the intertriginous area.  This is where it mainly occurs but is also had issues under her breast.  Occurs off and on mainly with heat and sweating.  Has tried to wear loose clothing and no underwear when at home.  Describes some itching and burning in the area, tried Lotrisone cream but does not remember this helping much.  Has also tried diaper rash ointment which has helped some.  Patient relates this to increased skin folds especially with weight gain. Patient also requesting refills on her regular medications.  Was seen on 12/26/2018, 03/09/2021 and for her Medicare wellness exam on 04/01/2019   Observations/Objective: Today's visit was via telephone Physical exam was not possible for this visit Alert, oriented.  Calm cheerful affect.  Assessment and Plan: Problem List Items Addressed This Visit       Musculoskeletal and Integument   Fungal skin infection - Primary   Relevant Medications   ketoconazole (NIZORAL) 2 % cream     Other   Anxiety and depression   Relevant Medications   clonazePAM (KLONOPIN) 0.5 MG tablet   doxepin (SINEQUAN) 50 MG capsule   Insomnia   Relevant Medications   clonazePAM (KLONOPIN) 0.5 MG tablet   doxepin (SINEQUAN) 50 MG  capsule   Muscle spasms of both lower extremities   Relevant Medications   tiZANidine (ZANAFLEX) 4 MG tablet   Meds ordered this encounter  Medications   clonazePAM (KLONOPIN) 0.5 MG tablet    Sig: TAKE ONE TABLET BY MOUTH ONCE A DAY AS NEEDED FOR INSOMNIA    Dispense:  30 tablet    Refill:  2    Order Specific Question:   Supervising Provider    Answer:   Sallee Lange A [[id number]]   tiZANidine (ZANAFLEX) 4 MG tablet    Sig: Take 1 tablet (4 mg total) by mouth daily as needed for muscle spasms.    Dispense:  30 tablet    Refill:  1    Order Specific Question:   Supervising Provider    Answer:   Sallee Lange A [[id number]]   esomeprazole (NEXIUM) 40 MG capsule    Sig: TAKE ONE CAPSULE (40MG  TOTAL) BY MOUTH DAILY BEFORE BREAKFAST    Dispense:  90 capsule    Refill:  1    Order Specific Question:   Supervising Provider    Answer:   Sallee Lange A [[id number]]   rosuvastatin (CRESTOR) 20 MG tablet    Sig: TAKE ONE TABLET (20MG  TOTAL) BY MOUTH DAILY    Dispense:  90 tablet    Refill:  1    Order Specific Question:   Supervising Provider    Answer:   Sallee Lange A [[id number]]   doxepin (SINEQUAN) 50 MG capsule    Sig: Take 1 capsule (50 mg  total) by mouth at bedtime.    Dispense:  90 capsule    Refill:  1    Order Specific Question:   Supervising Provider    Answer:   Sallee Lange A [[id number]]   ketoconazole (NIZORAL) 2 % cream    Sig: Apply 1 application topically 2 (two) times daily.    Dispense:  30 g    Refill:  0    Order Specific Question:   Supervising Provider    Answer:   Sallee Lange A [[id number]]   Last A1c on 04/14/2020 was 5.5.  Follow Up Instructions: Trial of ketoconazole combined with barrier cream to areas as needed.  Discussed the importance of keeping areas clean and dry.  Recommend loosefitting clothing particularly underwear when at home. Consider repeat yearly lab work at his next office visit.   I discussed the assessment and treatment plan with the patient. The patient was  provided an opportunity to ask questions and all were answered. The patient agreed with the plan and demonstrated an understanding of the instructions.   The patient was advised to call back or seek an in-person evaluation if the symptoms worsen or if the condition fails to improve as anticipated.  I provided 15 minutes of non-face-to-face time during this encounter.   Nilda Simmer, NP

## 2021-05-21 NOTE — Progress Notes (Signed)
I  Subjective:    Patient ID: Jasmine Patton, female    DOB: 08-18-1950, 71 y.o.   MRN: [id number]  HPI Patient reports redness, ,fissure like irritation in the folds of groin area, states diflucan has helped her in the past  Has been using desitin otc Night sweats and frequent sweating   Review of Systems     Objective:   Physical Exam        Assessment & Plan:

## 2021-05-22 ENCOUNTER — Encounter: Payer: Self-pay | Admitting: Nurse Practitioner

## 2021-05-24 ENCOUNTER — Telehealth: Payer: Self-pay

## 2021-05-24 MED ORDER — KETOCONAZOLE 2 % EX CREA: 1.0000 "application " | TOPICAL_CREAM | Freq: Two times a day (BID) | CUTANEOUS | 0 refills | Status: DC

## 2021-05-24 NOTE — Telephone Encounter (Signed)
Pt called in stating that her pharmacy's computers were down and would like to have this prescription ketoconazole (NIZORAL) 2 % cream resent. Please advise.  Cb#: [phone]

## 2021-05-24 NOTE — Telephone Encounter (Signed)
Patient informed that rx was sent in. Verbalized understanding.

## 2021-06-04 ENCOUNTER — Ambulatory Visit: Payer: Medicare PPO | Admitting: Nurse Practitioner

## 2021-06-09 ENCOUNTER — Other Ambulatory Visit: Payer: Self-pay | Admitting: Nurse Practitioner

## 2021-06-09 DIAGNOSIS — M62838 Other muscle spasm: Secondary | ICD-10-CM

## 2021-07-16 ENCOUNTER — Other Ambulatory Visit: Payer: Self-pay | Admitting: Family Medicine

## 2021-07-16 DIAGNOSIS — F5101 Primary insomnia: Secondary | ICD-10-CM

## 2021-07-16 DIAGNOSIS — F419 Anxiety disorder, unspecified: Secondary | ICD-10-CM

## 2021-09-02 ENCOUNTER — Other Ambulatory Visit: Payer: Self-pay | Admitting: Family Medicine

## 2021-09-24 ENCOUNTER — Ambulatory Visit: Payer: Medicare PPO | Admitting: Nurse Practitioner

## 2021-09-24 ENCOUNTER — Encounter: Payer: Self-pay | Admitting: Nurse Practitioner

## 2021-09-24 VITALS — BP 132/74 | Wt 176.2 lb

## 2021-09-24 DIAGNOSIS — R7303 Prediabetes: Secondary | ICD-10-CM

## 2021-09-24 DIAGNOSIS — F419 Anxiety disorder, unspecified: Secondary | ICD-10-CM | POA: Diagnosis not present

## 2021-09-24 DIAGNOSIS — E785 Hyperlipidemia, unspecified: Secondary | ICD-10-CM

## 2021-09-24 DIAGNOSIS — Z1329 Encounter for screening for other suspected endocrine disorder: Secondary | ICD-10-CM | POA: Diagnosis not present

## 2021-09-24 DIAGNOSIS — Z131 Encounter for screening for diabetes mellitus: Secondary | ICD-10-CM | POA: Diagnosis not present

## 2021-09-24 DIAGNOSIS — F5101 Primary insomnia: Secondary | ICD-10-CM | POA: Diagnosis not present

## 2021-09-24 DIAGNOSIS — Z79899 Other long term (current) drug therapy: Secondary | ICD-10-CM

## 2021-09-24 DIAGNOSIS — F32A Depression, unspecified: Secondary | ICD-10-CM | POA: Diagnosis not present

## 2021-09-24 DIAGNOSIS — Z1159 Encounter for screening for other viral diseases: Secondary | ICD-10-CM

## 2021-09-24 MED ORDER — DESVENLAFAXINE SUCCINATE ER 100 MG PO TB24: 100.0000 mg | ORAL_TABLET | Freq: Every day | ORAL | 1 refills | Status: DC

## 2021-09-24 MED ORDER — ESOMEPRAZOLE MAGNESIUM 40 MG PO CPDR: DELAYED_RELEASE_CAPSULE | ORAL | 1 refills | Status: DC

## 2021-09-24 MED ORDER — CLONAZEPAM 0.5 MG PO TABS: ORAL_TABLET | ORAL | 2 refills | Status: DC

## 2021-09-25 ENCOUNTER — Encounter: Payer: Self-pay | Admitting: Nurse Practitioner

## 2021-09-25 DIAGNOSIS — R7303 Prediabetes: Secondary | ICD-10-CM | POA: Insufficient documentation

## 2021-09-25 LAB — LIPID PANEL
Chol/HDL Ratio: 3.2 ratio (ref 0.0–4.4)
Cholesterol, Total: 186 mg/dL (ref 100–199)
HDL: 59 mg/dL (ref >39)
LDL Chol Calc (NIH): 101 mg/dL — ABNORMAL HIGH (ref 0–99)
Triglycerides: 153 mg/dL — ABNORMAL HIGH (ref 0–149)
VLDL Cholesterol Cal: 26 mg/dL (ref 5–40)

## 2021-09-25 LAB — CMP14+EGFR
ALT: 18 IU/L (ref 0–32)
AST: 21 IU/L (ref 0–40)
Albumin/Globulin Ratio: 1.8 (ref 1.2–2.2)
Albumin: 4.5 g/dL (ref 3.8–4.8)
Alkaline Phosphatase: 82 IU/L (ref 44–121)
BUN/Creatinine Ratio: 16 (ref 12–28)
BUN: 15 mg/dL (ref 8–27)
Bilirubin Total: 0.3 mg/dL (ref 0.0–1.2)
CO2: 26 mmol/L (ref 20–29)
Calcium: 9.5 mg/dL (ref 8.7–10.3)
Chloride: 96 mmol/L (ref 96–106)
Creatinine, Ser: 0.94 mg/dL (ref 0.57–1.00)
Globulin, Total: 2.5 g/dL (ref 1.5–4.5)
Glucose: 102 mg/dL — ABNORMAL HIGH (ref 70–99)
Potassium: 4.7 mmol/L (ref 3.5–5.2)
Sodium: 137 mmol/L (ref 134–144)
Total Protein: 7 g/dL (ref 6.0–8.5)
eGFR: 65 mL/min/{1.73_m2} (ref >59)

## 2021-09-25 LAB — CBC WITH DIFFERENTIAL/PLATELET
Basophils Absolute: 0.1 10*3/uL (ref 0.0–0.2)
Basos: 1 %
EOS (ABSOLUTE): 0.2 10*3/uL (ref 0.0–0.4)
Eos: 3 %
Hematocrit: 38.6 % (ref 34.0–46.6)
Hemoglobin: 12.7 g/dL (ref 11.1–15.9)
Immature Grans (Abs): 0.1 10*3/uL (ref 0.0–0.1)
Immature Granulocytes: 1 %
Lymphocytes Absolute: 2.1 10*3/uL (ref 0.7–3.1)
Lymphs: 30 %
MCH: 30.2 pg (ref 26.6–33.0)
MCHC: 32.9 g/dL (ref 31.5–35.7)
MCV: 92 fL (ref 79–97)
Monocytes Absolute: 0.5 10*3/uL (ref 0.1–0.9)
Monocytes: 7 %
Neutrophils Absolute: 4.1 10*3/uL (ref 1.4–7.0)
Neutrophils: 58 %
Platelets: 327 10*3/uL (ref 150–450)
RBC: 4.2 x10E6/uL (ref 3.77–5.28)
RDW: 12.8 % (ref 11.7–15.4)
WBC: 7 10*3/uL (ref 3.4–10.8)

## 2021-09-25 LAB — TSH: TSH: 2.84 u[IU]/mL (ref 0.450–4.500)

## 2021-09-25 LAB — HEMOGLOBIN A1C
Est. average glucose Bld gHb Est-mCnc: 120 mg/dL
Hgb A1c MFr Bld: 5.8 % — ABNORMAL HIGH (ref 4.8–5.6)

## 2021-09-25 LAB — HEPATITIS C ANTIBODY: Hep C Virus Ab: NONREACTIVE

## 2021-11-03 ENCOUNTER — Ambulatory Visit (INDEPENDENT_AMBULATORY_CARE_PROVIDER_SITE_OTHER): Payer: Medicare PPO | Admitting: Nurse Practitioner

## 2021-11-03 ENCOUNTER — Encounter: Payer: Self-pay | Admitting: Nurse Practitioner

## 2021-11-03 DIAGNOSIS — N3090 Cystitis, unspecified without hematuria: Secondary | ICD-10-CM

## 2021-11-03 LAB — POCT URINALYSIS DIP (CLINITEK)
Nitrite, UA: NEGATIVE
POC PROTEIN,UA: NEGATIVE
Spec Grav, UA: 1.01

## 2021-11-03 MED ORDER — SULFAMETHOXAZOLE-TRIMETHOPRIM 800-160 MG PO TABS: 1.0000 | ORAL_TABLET | Freq: Two times a day (BID) | ORAL | 0 refills | Status: AC

## 2021-11-03 NOTE — Progress Notes (Signed)
     Virtual Visit via telephone Note  I connected with Jasmine Patton on 11/03/21 at 1135  by telephone and verified that I am speaking with the correct person using two identifiers. Jasmine Patton is currently located at home. The provider, Trenton Gammon Lanora Reveron, NP is located in their office at time of visit.  Call ended at 1145  I discussed the limitations, risks, security and privacy concerns of performing an evaluation and management service by telephone and the availability of in person appointments. I also discussed with the patient that there may be a patient responsible charge related to this service. The patient expressed understanding and agreed to proceed.   History and Present Illness:  Patient states that symptoms started approximately 4 days ago.  Patient states she was experiencing urgency and frequency, urinary smell, and urinary pressure when urinating.  Patient denies any fevers, chills, body aches, backaches.    Observations/Objective: Patient did not appear to be in any distress during phone visit.  Patient able to answer all questions appropriately difficulty breathing.  Patient able to complete sentences without stopping.  Assessment and Plan: Problem List Items Addressed This Visit   None Visit Diagnoses     Cystitis    -  Primary   Relevant Medications   sulfamethoxazole-trimethoprim (BACTRIM DS) 800-160 MG tablet   Other Relevant Orders   POCT URINALYSIS DIP (CLINITEK) (Completed)       1. Cystitis -We will treat patient for UTI with Bactrim twice daily for 3 days - POCT URINALYSIS DIP (CLINITEK) - sulfamethoxazole-trimethoprim (BACTRIM DS) 800-160 MG tablet; Take 1 tablet by mouth 2 (two) times daily for 3 days.  Dispense: 6 tablet; Refill: 0 -Urine culture pending -Return to clinic if symptoms do not improve or worsen or if you develop fever, chills, backaches.     I discussed the assessment and treatment plan with the patient. The patient was  provided an opportunity to ask questions and all were answered. The patient agreed with the plan and demonstrated an understanding of the instructions.   The patient was advised to call back or seek an in-person evaluation if the symptoms worsen or if the condition fails to improve as anticipated.  The above assessment and management plan was discussed with the patient. The patient verbalized understanding of and has agreed to the management plan. Patient is aware to call the clinic if symptoms persist or worsen. Patient is aware when to return to the clinic for a follow-up visit. Patient educated on when it is appropriate to go to the emergency department.    I provided 10 minutes of non-face-to-face time during this encounter.    Claire Shown, NP

## 2021-11-03 NOTE — Progress Notes (Signed)
Urinary frequency, heavy feeling in bladder area,fatigue

## 2021-11-07 LAB — URINE CULTURE

## 2021-11-07 LAB — SPECIMEN STATUS REPORT

## 2021-12-16 ENCOUNTER — Encounter: Payer: Self-pay | Admitting: Nurse Practitioner

## 2021-12-21 ENCOUNTER — Other Ambulatory Visit: Payer: Self-pay | Admitting: Nurse Practitioner

## 2021-12-21 DIAGNOSIS — F419 Anxiety disorder, unspecified: Secondary | ICD-10-CM

## 2021-12-21 DIAGNOSIS — F5101 Primary insomnia: Secondary | ICD-10-CM

## 2022-01-24 ENCOUNTER — Other Ambulatory Visit: Payer: Self-pay | Admitting: Nurse Practitioner

## 2022-01-24 DIAGNOSIS — F5101 Primary insomnia: Secondary | ICD-10-CM

## 2022-02-10 IMAGING — MG DIGITAL SCREENING BILAT W/ TOMO W/ CAD
6 of 10 series · 6 of 30 positions shown · non-contrast
Comparison: Previous exam(s).

CLINICAL DATA: Screening.

EXAM:
DIGITAL SCREENING BILATERAL MAMMOGRAM WITH TOMO AND CAD

[R MLO synth-2D (1 of 2)]
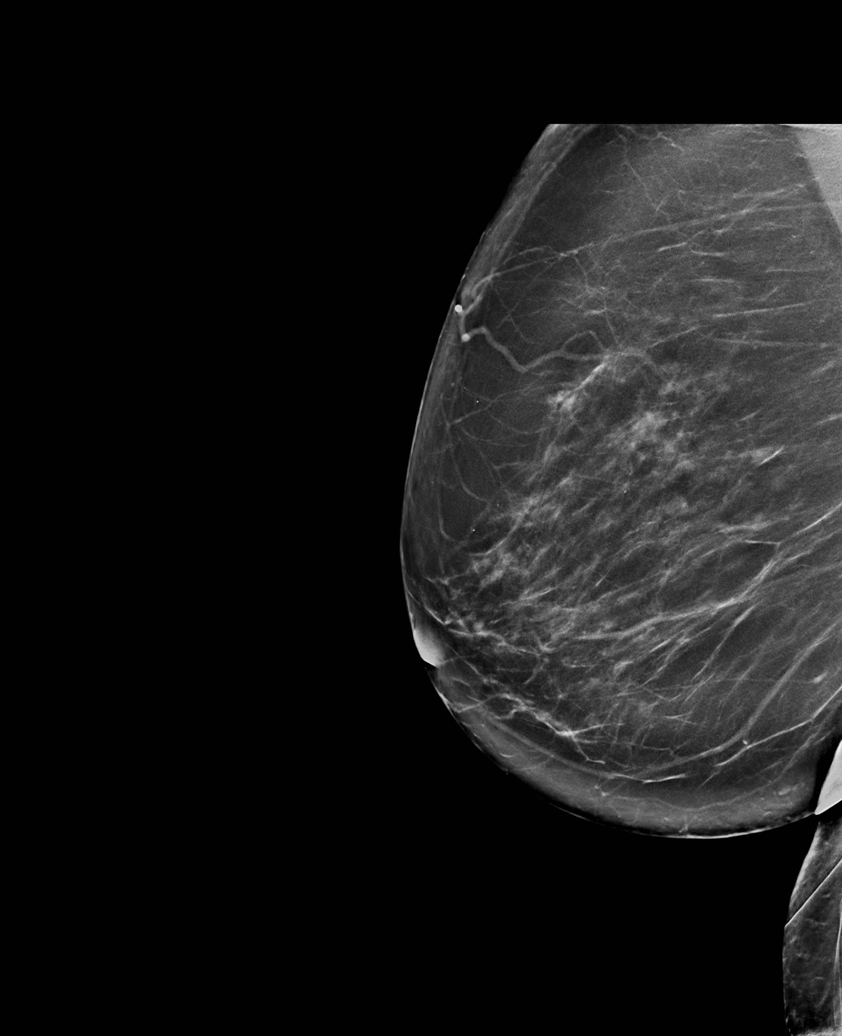

[L MLO synth-2D]
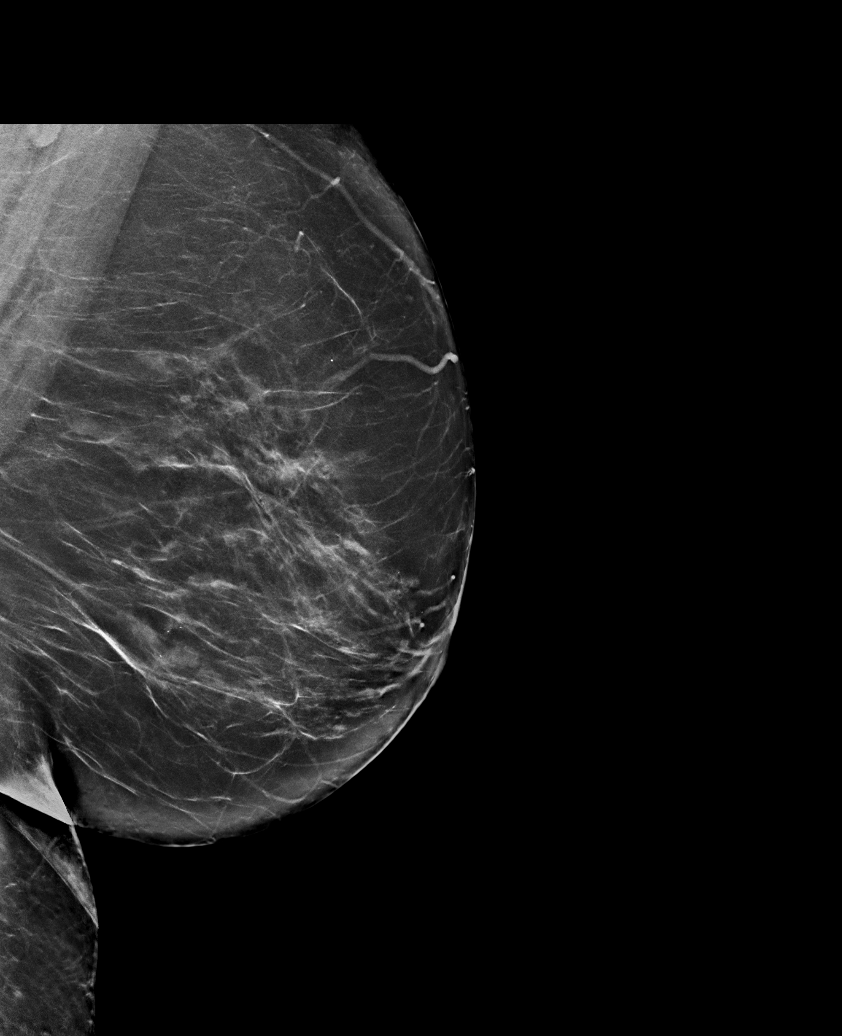

[R CC synth-2D]
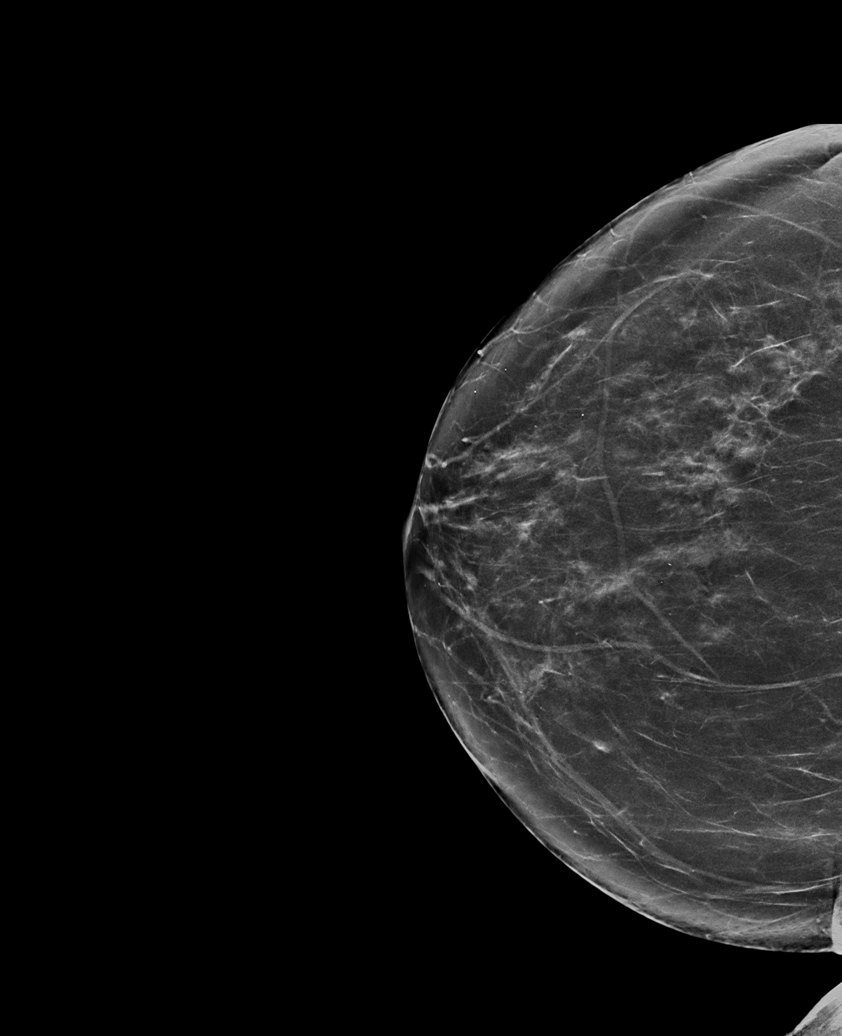

[L CC synth-2D]
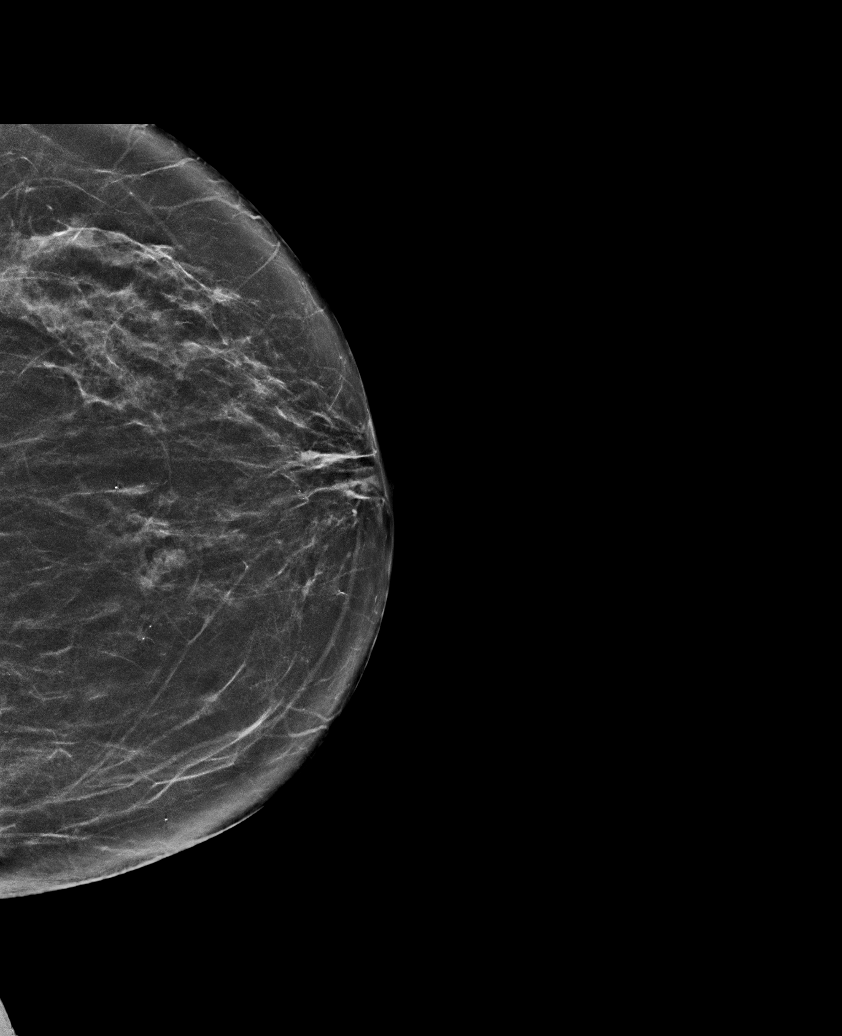

[R MLO synth-2D (2 of 2)]
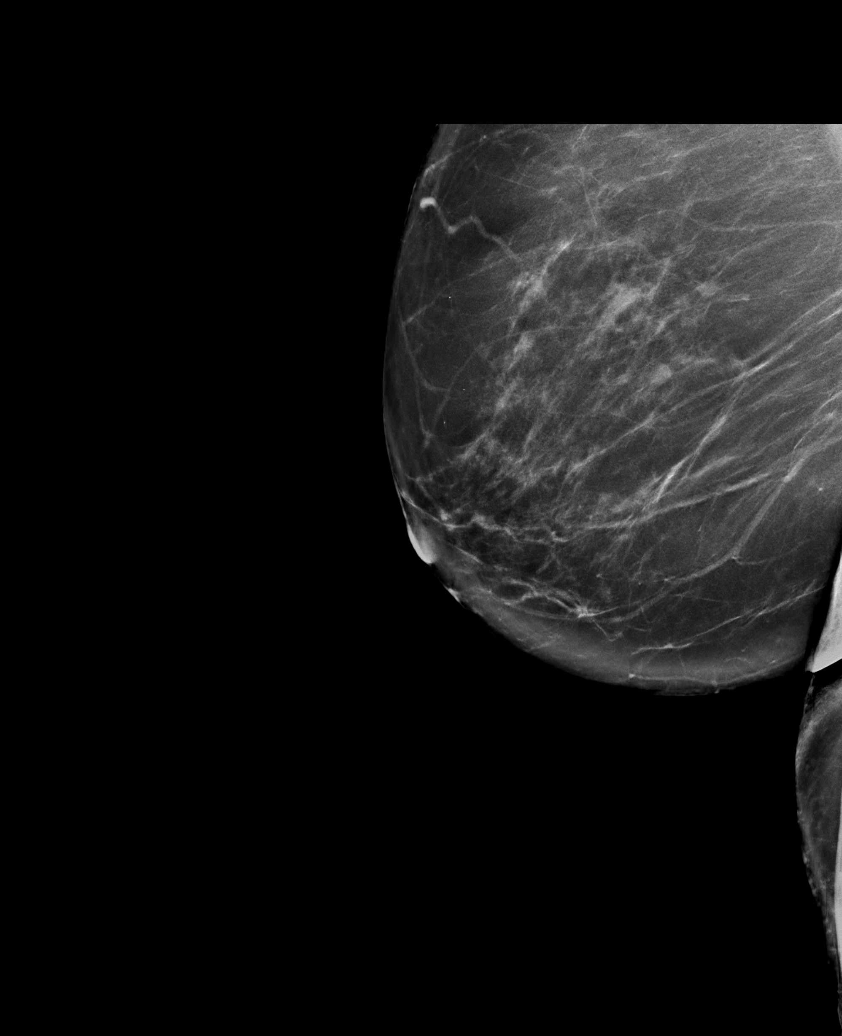

[R CC tomo · tomo slice 37/72.0]
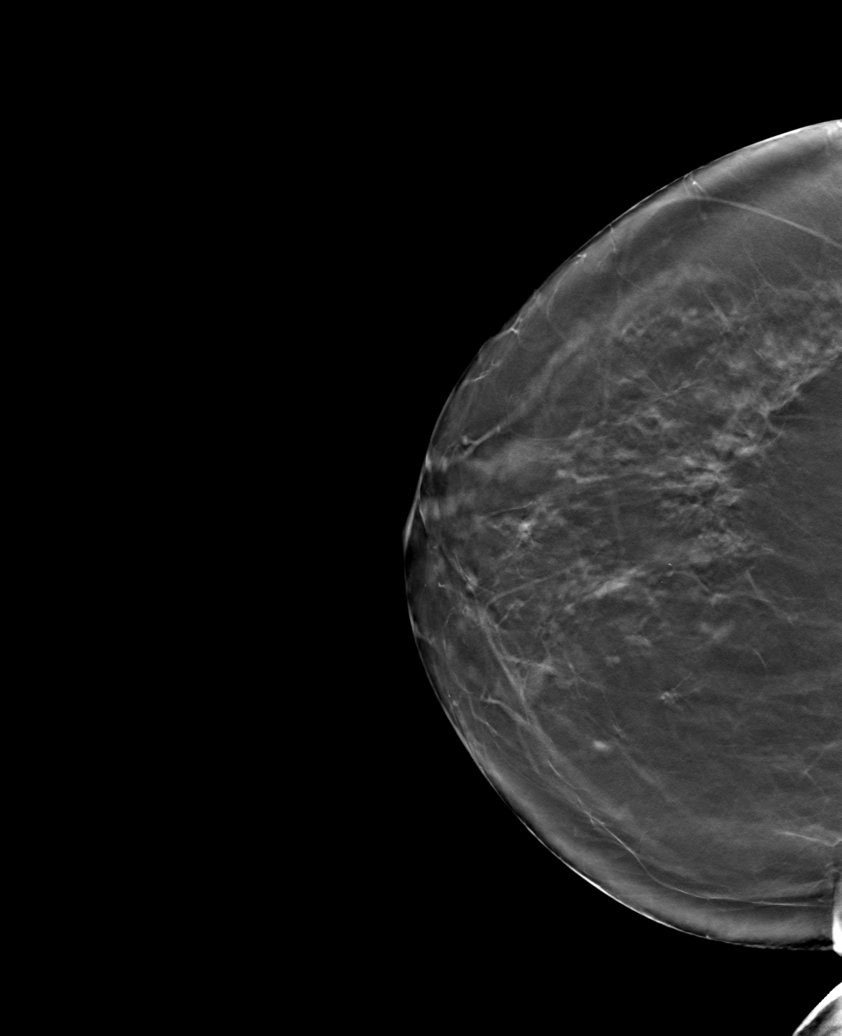

[6 of 30 positions shown; findings below may reference images not displayed]

ACR Breast Density Category b: There are scattered areas of
fibroglandular density.
FINDINGS: There are no findings suspicious for malignancy. Images were
processed with CAD.
IMPRESSION: No mammographic evidence of malignancy. A result letter of this
screening mammogram will be mailed directly to the patient.

RECOMMENDATION:
Screening mammogram in one year. (Code:[id number])

BI-RADS CATEGORY  1: Negative.

## 2022-02-12 ENCOUNTER — Encounter (INDEPENDENT_AMBULATORY_CARE_PROVIDER_SITE_OTHER): Payer: Self-pay | Admitting: Gastroenterology

## 2022-02-17 ENCOUNTER — Encounter: Payer: Self-pay | Admitting: Nurse Practitioner

## 2022-02-17 ENCOUNTER — Ambulatory Visit (INDEPENDENT_AMBULATORY_CARE_PROVIDER_SITE_OTHER): Payer: Medicare PPO | Admitting: Nurse Practitioner

## 2022-02-17 VITALS — BP 122/58 | HR 106 | Temp 98.1°F | Ht 61.0 in | Wt 176.2 lb

## 2022-02-17 DIAGNOSIS — Z0000 Encounter for general adult medical examination without abnormal findings: Secondary | ICD-10-CM

## 2022-02-17 DIAGNOSIS — Z23 Encounter for immunization: Secondary | ICD-10-CM | POA: Diagnosis not present

## 2022-02-17 DIAGNOSIS — Z1231 Encounter for screening mammogram for malignant neoplasm of breast: Secondary | ICD-10-CM | POA: Diagnosis not present

## 2022-02-17 NOTE — Patient Instructions (Addendum)
Adult wellness-complete.wellness physical was conducted today. Importance of diet and exercise were discussed in detail.  Importance of stress reduction and healthy living were discussed.  In addition to this a discussion regarding safety was also covered.  We also reviewed over immunizations and gave recommendations regarding current immunization needed for age.   In addition to this additional areas were also touched on including: Preventative health exams needed:  - Colonoscopy not indicated. But to follow up with GI for diarrhea.  - Mammogram 03/09/2022 at 2:15pm  - Tetanus: Patient state will get from [place] #4 completed 02/14/2022  - Flu Shot to be postponed 4 weeks since you received COVID vax on 02/14/2022   Patient was advised yearly wellness exam

## 2022-02-17 NOTE — Progress Notes (Signed)
   Subjective:    Patient ID: Jasmine Patton, female    DOB: [birth year], 71 y.o.   MRN: [id number]  HPI  AWV- Annual Wellness Visit  The patient was seen for their annual wellness visit. The patient's past medical history, surgical history, and family history were reviewed. Pertinent vaccines were reviewed ( tetanus, pneumonia, shingles, flu) The patient's medication list was reviewed and updated.  The height and weight were entered.  BMI recorded in electronic record elsewhere  Cognitive screening was completed.   Falls /depression screening electronically recorded within record elsewhere  Current tobacco usage:no (All patients who use tobacco were given written and verbal information on quitting)  Recent listing of emergency department/hospitalizations over the past year were reviewed.  current specialist the patient sees on a regular basis: neurologist    Medicare annual wellness visit patient questionnaire was reviewed.  A written screening schedule for the patient for the next 5-10 years was given. Appropriate discussion of followup regarding next visit was discussed.  Patient would like flu shot, and referral for Gastroenterologist.      Review of Systems  HENT:         Chronic diarrhea. Using lomotil which helps.   All other systems reviewed and are negative.      Objective:   Physical Exam Constitutional:      General: She is not in acute distress.    Appearance: Normal appearance. She is obese. She is not ill-appearing or toxic-appearing.  HENT:     Head: Normocephalic and atraumatic.  Neurological:     Mental Status: She is alert.  Psychiatric:        Mood and Affect: Mood normal.        Behavior: Behavior normal.           Assessment & Plan:   1. Encounter for Medicare annual wellness exam Adult wellness-complete.wellness physical was conducted today. Importance of diet and exercise were discussed in detail.  Importance of stress reduction  and healthy living were discussed.  In addition to this a discussion regarding safety was also covered.  We also reviewed over immunizations and gave recommendations regarding current immunization needed for age.   In addition to this additional areas were also touched on including: Preventative health exams needed:  - Colonoscopy not indicated. But to follow up with GI for diarrhea  - Mammogram scheduled 03/09/2022 at 2:15pm  - Tetanus Patient state will get from [place] #4 completed 02/14/2022  - Flu Shot postpond 4 week due to recent COVID vaccine   Patient was advised yearly wellness exam

## 2022-02-17 NOTE — Progress Notes (Deleted)
1 

## 2022-03-07 ENCOUNTER — Other Ambulatory Visit (HOSPITAL_COMMUNITY): Payer: Self-pay | Admitting: Nurse Practitioner

## 2022-03-07 DIAGNOSIS — Z0000 Encounter for general adult medical examination without abnormal findings: Secondary | ICD-10-CM

## 2022-03-07 DIAGNOSIS — Z1231 Encounter for screening mammogram for malignant neoplasm of breast: Secondary | ICD-10-CM

## 2022-03-07 DIAGNOSIS — Z23 Encounter for immunization: Secondary | ICD-10-CM

## 2022-03-09 ENCOUNTER — Inpatient Hospital Stay (HOSPITAL_COMMUNITY): Admission: RE | Admit: 2022-03-09 | Payer: Medicare PPO | Source: Ambulatory Visit

## 2022-03-15 ENCOUNTER — Other Ambulatory Visit: Payer: Self-pay | Admitting: Family Medicine

## 2022-04-28 ENCOUNTER — Other Ambulatory Visit: Payer: Self-pay | Admitting: Family Medicine

## 2022-04-28 DIAGNOSIS — F32A Depression, unspecified: Secondary | ICD-10-CM

## 2022-04-28 DIAGNOSIS — F5101 Primary insomnia: Secondary | ICD-10-CM

## 2022-06-13 ENCOUNTER — Ambulatory Visit: Payer: Medicare PPO | Admitting: Family Medicine

## 2022-06-13 VITALS — BP 144/83 | HR 106 | Temp 97.5°F | Ht 61.0 in | Wt 171.0 lb

## 2022-06-13 DIAGNOSIS — F32A Depression, unspecified: Secondary | ICD-10-CM

## 2022-06-13 DIAGNOSIS — F419 Anxiety disorder, unspecified: Secondary | ICD-10-CM

## 2022-06-13 DIAGNOSIS — K580 Irritable bowel syndrome with diarrhea: Secondary | ICD-10-CM

## 2022-06-13 DIAGNOSIS — F5101 Primary insomnia: Secondary | ICD-10-CM | POA: Diagnosis not present

## 2022-06-13 DIAGNOSIS — K582 Mixed irritable bowel syndrome: Secondary | ICD-10-CM | POA: Insufficient documentation

## 2022-06-13 DIAGNOSIS — K589 Irritable bowel syndrome without diarrhea: Secondary | ICD-10-CM | POA: Insufficient documentation

## 2022-06-13 MED ORDER — DESVENLAFAXINE SUCCINATE ER 100 MG PO TB24: 100.0000 mg | ORAL_TABLET | Freq: Every day | ORAL | 3 refills | Status: DC

## 2022-06-13 MED ORDER — DOXEPIN HCL 50 MG PO CAPS: ORAL_CAPSULE | ORAL | 3 refills | Status: DC

## 2022-06-13 MED ORDER — ROSUVASTATIN CALCIUM 20 MG PO TABS: ORAL_TABLET | ORAL | 3 refills | Status: DC

## 2022-06-13 MED ORDER — CLONAZEPAM 0.5 MG PO TABS: ORAL_TABLET | ORAL | 3 refills | Status: DC

## 2022-06-13 MED ORDER — DIPHENOXYLATE-ATROPINE 2.5-0.025 MG PO TABS: ORAL_TABLET | ORAL | 3 refills | Status: DC

## 2022-06-13 NOTE — Assessment & Plan Note (Signed)
Lomotil refilled.  Gave information regarding probiotic.

## 2022-06-13 NOTE — Patient Instructions (Signed)
Continue your medications.    Follow up in 6 months

## 2022-06-13 NOTE — Assessment & Plan Note (Signed)
Stable.  Clonazepam refilled.  Pristiq refilled.

## 2022-06-13 NOTE — Progress Notes (Signed)
Subjective:  Patient ID: Jasmine Patton, female    DOB: 03-20-1951  Age: 72 y.o. MRN: [id number]  CC: Chief Complaint  Patient presents with   Diarrhea    HPI:  72 year old female with the below mentioned medical problems presents for follow-up regarding her medications.  Patient continues to have intermittent diarrhea.  This is consistent with IBS D.  She states Lomotil helps.  She does have a few times a month where she has persistent diarrhea.  She would like to discuss whether anything else can be done to aid in her symptoms.  Patient's anxiety and depression is stable on Pristiq and clonazepam.  Patient needs refill on doxepin which is used for insomnia.  Patient Active Problem List   Diagnosis Date Noted   IBS (irritable bowel syndrome) 06/13/2022   Prediabetes 09/25/2021   Muscle spasms of both lower extremities 06/11/2020   Hyperlipidemia 12/30/2016   Gastroesophageal reflux disease without esophagitis 07/13/2016   Family hx of colon cancer 07/13/2016   Insomnia 02/09/2014   Anxiety and depression 05/05/2009   Multiple sclerosis ([place]) 05/05/2009    Social Hx   Social History   Socioeconomic History   Marital status: Married    Spouse name: Not on file   Number of children: Not on file   Years of education: Not on file   Highest education level: Not on file  Occupational History   Not on file  Tobacco Use   Smoking status: Never   Smokeless tobacco: Never  Vaping Use   Vaping Use: Never used  Substance and Sexual Activity   Alcohol use: Yes    Comment: Socially   Drug use: No   Sexual activity: Not on file  Other Topics Concern   Not on file  Social History Narrative   Not on file   Social Determinants of Health   Financial Resource Strain: Not on file  Food Insecurity: Not on file  Transportation Needs: Not on file  Physical Activity: Not on file  Stress: Not on file  Social Connections: Not on file    Review of Systems Per  HPI  Objective:  BP (!) 144/83   Pulse (!) 106   Temp (!) 97.5 F (36.4 C)   Ht '5\' 1"'$  (1.549 m)   Wt 171 lb (77.6 kg)   SpO2 97%   BMI 32.31 kg/m      06/13/2022   10:38 AM 02/17/2022   11:08 AM 02/17/2022   10:43 AM  BP/Weight  Systolic BP 123456 [id number]  Diastolic BP 83 58 60  Wt. (Lbs) 171  176.2  BMI 32.31 kg/m2  33.29 kg/m2    Physical Exam Vitals and nursing note reviewed.  Constitutional:      General: She is not in acute distress.    Appearance: Normal appearance. She is obese.  HENT:     Head: Normocephalic and atraumatic.  Eyes:     General:        Right eye: No discharge.        Left eye: No discharge.     Conjunctiva/sclera: Conjunctivae normal.  Cardiovascular:     Rate and Rhythm: Normal rate and regular rhythm.  Pulmonary:     Effort: Pulmonary effort is normal.     Breath sounds: Normal breath sounds. No wheezing, rhonchi or rales.  Neurological:     Mental Status: She is alert.  Psychiatric:        Mood and Affect: Mood normal.  Behavior: Behavior normal.     Lab Results  Component Value Date   WBC 7.0 09/24/2021   HGB 12.7 09/24/2021   HCT 38.6 09/24/2021   PLT 327 09/24/2021   GLUCOSE 102 (H) 09/24/2021   CHOL 186 09/24/2021   TRIG 153 (H) 09/24/2021   HDL 59 09/24/2021   LDLCALC 101 (H) 09/24/2021   ALT 18 09/24/2021   AST 21 09/24/2021   NA 137 09/24/2021   K 4.7 09/24/2021   CL 96 09/24/2021   CREATININE 0.94 09/24/2021   BUN 15 09/24/2021   CO2 26 09/24/2021   TSH 2.840 09/24/2021   HGBA1C 5.8 (H) 09/24/2021     Assessment & Plan:   Problem List Items Addressed This Visit       Digestive   IBS (irritable bowel syndrome) - Primary    Lomotil refilled.  Gave information regarding probiotic.      Relevant Medications   diphenoxylate-atropine (LOMOTIL) 2.5-0.025 MG tablet     Other   Anxiety and depression    Stable.  Clonazepam refilled.  Pristiq refilled.      Relevant Medications   clonazePAM  (KLONOPIN) 0.5 MG tablet   desvenlafaxine (PRISTIQ) 100 MG 24 hr tablet   doxepin (SINEQUAN) 50 MG capsule   Insomnia    Stable.  Doxepin refilled.      Relevant Medications   clonazePAM (KLONOPIN) 0.5 MG tablet   doxepin (SINEQUAN) 50 MG capsule    Meds ordered this encounter  Medications   clonazePAM (KLONOPIN) 0.5 MG tablet    Sig: TAKE ONE TABLET BY MOUTH ONCE A DAY AS NEEDED FOR INSOMNIA    Dispense:  30 tablet    Refill:  3   desvenlafaxine (PRISTIQ) 100 MG 24 hr tablet    Sig: Take 1 tablet (100 mg total) by mouth daily.    Dispense:  90 tablet    Refill:  3   diphenoxylate-atropine (LOMOTIL) 2.5-0.025 MG tablet    Sig: TAKE ONE (1) TABLET BY MOUTH FOUR (4) TIMES DAILY AS NEEDED FOR DIARRHEA OR LOOSE STOOLS    Dispense:  30 tablet    Refill:  3   doxepin (SINEQUAN) 50 MG capsule    Sig: TAKE ONE CAPSULE (50 MG TOTAL) BY MOUTH AT BEDTIME.    Dispense:  90 capsule    Refill:  3   rosuvastatin (CRESTOR) 20 MG tablet    Sig: TAKE ONE TABLET ('20MG'$  TOTAL) BY MOUTH DAILY    Dispense:  90 tablet    Refill:  3    Follow-up:  6 months  [place] DO [place]

## 2022-06-13 NOTE — Assessment & Plan Note (Signed)
Stable.  Doxepin refilled.

## 2022-09-22 ENCOUNTER — Other Ambulatory Visit: Payer: Self-pay | Admitting: Family Medicine

## 2022-09-22 DIAGNOSIS — M62838 Other muscle spasm: Secondary | ICD-10-CM

## 2022-11-02 ENCOUNTER — Ambulatory Visit: Payer: Medicare PPO | Admitting: Nurse Practitioner

## 2022-11-02 VITALS — BP 130/82 | HR 98 | Wt 168.6 lb

## 2022-11-02 DIAGNOSIS — E785 Hyperlipidemia, unspecified: Secondary | ICD-10-CM | POA: Diagnosis not present

## 2022-11-02 DIAGNOSIS — F5101 Primary insomnia: Secondary | ICD-10-CM

## 2022-11-02 DIAGNOSIS — F419 Anxiety disorder, unspecified: Secondary | ICD-10-CM

## 2022-11-02 DIAGNOSIS — F32A Depression, unspecified: Secondary | ICD-10-CM

## 2022-11-02 DIAGNOSIS — Z1329 Encounter for screening for other suspected endocrine disorder: Secondary | ICD-10-CM

## 2022-11-02 DIAGNOSIS — R7303 Prediabetes: Secondary | ICD-10-CM

## 2022-11-02 DIAGNOSIS — Z130 Encounter for screening for diseases of the blood and blood-forming organs and certain disorders involving the immune mechanism: Secondary | ICD-10-CM

## 2022-11-02 MED ORDER — TRAZODONE HCL 50 MG PO TABS: 25.0000 mg | ORAL_TABLET | Freq: Every evening | ORAL | 0 refills | Status: DC | PRN

## 2022-11-02 NOTE — Progress Notes (Signed)
Subjective:    Patient ID: Jasmine Patton, female    DOB: [birth year], 72 y.o.   MRN: [id number]  HPI Patient arrives today for blood sugar checks. Patient states she thinks she is having blood sugar changes after eating and drinking. Patient states she gets very sleepy.  Has been under more stress lately, states her son and his partner along with her children are temporarily living with her.  Has caused some increased stress lately.  Continues to have difficulty sleeping despite the use of doxepin and melatonin.  Has made some changes to her diet including eating more lean protein and limiting her sweets and carbs.  Review of Systems  Constitutional:  Positive for fatigue.  HENT:  Negative for sore throat and trouble swallowing.   Respiratory:  Negative for cough, chest tightness and shortness of breath.   Cardiovascular:  Negative for chest pain and leg swelling.  Psychiatric/Behavioral:  Positive for sleep disturbance.       11/02/2022   10:59 AM  Depression screen PHQ 2/9  Decreased Interest 2  Down, Depressed, Hopeless 0  PHQ - 2 Score 2  Altered sleeping 2  Tired, decreased energy 3  Change in appetite 3  Feeling bad or failure about yourself  0  Trouble concentrating 2  Moving slowly or fidgety/restless 1  Suicidal thoughts 0  PHQ-9 Score 13  Difficult doing work/chores Somewhat difficult      11/02/2022   10:59 AM 06/13/2022   10:51 AM 02/17/2022   10:52 AM 01/22/2020    9:46 AM  GAD 7 : Generalized Anxiety Score  Nervous, Anxious, on Edge 2 0 1 1  Control/stop worrying 1 0 1 1  Worry too much - different things 2 1 1 1   Trouble relaxing 2 1 0 1  Restless 1 0 0 0  Easily annoyed or irritable 1 0 1 1  Afraid - awful might happen 1 0 0 1  Total GAD 7 Score 10 2 4 6   Anxiety Difficulty Somewhat difficult Somewhat difficult Somewhat difficult Somewhat difficult         Objective:   Physical Exam NAD.  Alert, oriented.  Calm affect.  Making good eye contact.   Speech clear.  Dressed appropriately for the weather.  Thoughts logical coherent and relevant thyroid nontender to palpation, no mass or goiter noted.  Lungs clear.  Heart regular rate rhythm. Today's Vitals   11/02/22 1058  BP: 130/82  Pulse: 98  SpO2: 97%  Weight: 168 lb 9.6 oz (76.5 kg)   Body mass index is 31.86 kg/m.       Assessment & Plan:   Problem List Items Addressed This Visit       Other   Anxiety and depression   Relevant Medications   traZODone (DESYREL) 50 MG tablet   Hyperlipidemia - Primary   Relevant Orders   Lipid panel   Insomnia   Prediabetes   Relevant Orders   Comprehensive metabolic panel   Hemoglobin A1c   Other Visit Diagnoses     Screening for thyroid disorder       Relevant Orders   TSH   Screening for deficiency anemia       Relevant Orders   CBC with Differential/Platelet      Meds ordered this encounter  Medications   traZODone (DESYREL) 50 MG tablet    Sig: Take 0.5-1 tablets (25-50 mg total) by mouth at bedtime as needed for sleep.    Dispense:  30 tablet  Refill:  0    Order Specific Question:   Supervising Provider    Answer:   Lilyan Punt A [[id number]]   Hold on doxepin.  Trial of trazodone low-dose for sleep.  Discussed importance of stress reduction.  Continue other medications as directed.  Call back if no improvement. Routine labs ordered. Dexcom 7 device placed so patient can monitor her blood sugars over the next several days to see if there is any increase or decrease based on her diet or certain foods. Return in about 6 months (around 05/05/2023). Call back sooner if needed.

## 2022-11-04 ENCOUNTER — Other Ambulatory Visit: Payer: Self-pay | Admitting: Nurse Practitioner

## 2022-11-04 ENCOUNTER — Encounter: Payer: Self-pay | Admitting: Nurse Practitioner

## 2022-11-04 DIAGNOSIS — G35 Multiple sclerosis: Secondary | ICD-10-CM

## 2022-11-15 DIAGNOSIS — E785 Hyperlipidemia, unspecified: Secondary | ICD-10-CM | POA: Diagnosis not present

## 2022-11-15 DIAGNOSIS — Z1329 Encounter for screening for other suspected endocrine disorder: Secondary | ICD-10-CM | POA: Diagnosis not present

## 2022-11-15 DIAGNOSIS — R7303 Prediabetes: Secondary | ICD-10-CM | POA: Diagnosis not present

## 2022-11-15 DIAGNOSIS — Z130 Encounter for screening for diseases of the blood and blood-forming organs and certain disorders involving the immune mechanism: Secondary | ICD-10-CM | POA: Diagnosis not present

## 2022-11-21 ENCOUNTER — Encounter: Payer: Self-pay | Admitting: Nurse Practitioner

## 2022-11-22 ENCOUNTER — Other Ambulatory Visit: Payer: Self-pay | Admitting: Nurse Practitioner

## 2022-11-22 DIAGNOSIS — K580 Irritable bowel syndrome with diarrhea: Secondary | ICD-10-CM

## 2022-11-25 ENCOUNTER — Other Ambulatory Visit: Payer: Self-pay | Admitting: Family Medicine

## 2022-11-25 DIAGNOSIS — F5101 Primary insomnia: Secondary | ICD-10-CM

## 2022-11-25 DIAGNOSIS — F32A Depression, unspecified: Secondary | ICD-10-CM

## 2022-11-28 ENCOUNTER — Encounter (INDEPENDENT_AMBULATORY_CARE_PROVIDER_SITE_OTHER): Payer: Self-pay | Admitting: Gastroenterology

## 2022-11-28 ENCOUNTER — Ambulatory Visit (INDEPENDENT_AMBULATORY_CARE_PROVIDER_SITE_OTHER): Payer: Medicare PPO | Admitting: Gastroenterology

## 2022-11-28 VITALS — BP 125/86 | HR 106 | Temp 97.8°F | Ht 61.0 in | Wt 170.1 lb

## 2022-11-28 DIAGNOSIS — R197 Diarrhea, unspecified: Secondary | ICD-10-CM | POA: Diagnosis not present

## 2022-11-28 DIAGNOSIS — K219 Gastro-esophageal reflux disease without esophagitis: Secondary | ICD-10-CM | POA: Diagnosis not present

## 2022-11-28 DIAGNOSIS — K582 Mixed irritable bowel syndrome: Secondary | ICD-10-CM | POA: Diagnosis not present

## 2022-11-28 DIAGNOSIS — K449 Diaphragmatic hernia without obstruction or gangrene: Secondary | ICD-10-CM | POA: Diagnosis not present

## 2022-11-28 NOTE — Progress Notes (Unsigned)
Katrinka Blazing, M.D. Gastroenterology & Hepatology [facility] Gastroenterology [address] [place], [place] [address] Primary Care Physician: Tommie Sams, DO [address] [place] [address]  Referring MD: PCP  Chief Complaint:  changes in bowel movement frequency and GERD.  History of Present Illness: Jasmine Patton is a 72 y.o. female with PMH MS, ADHD, anxiety, depression, GERD, hyperlipidemia, osteopenia, who presents for evaluation of changes in bowel movement frequency and GERD.  Patient reports that she has had issues with fluctuation in her bowel movements for the last 4-5 years. States it started around 5 years ago. She was seeing Dr. Karilyn Cota in the past for this. She reports that she has tried to keep a food journal to avoid - knows chili, burgers or dairy may worsen her symptoms. States she usually has constipation for 3 or more days, which is followed by abdominal discomfort/abdominal ache with some distention. This is eventually followed by a day of severe diarrhea for one day. She is not taking any laxatives. She reports that she has to strain significantly to move her bowels, which may lead to some rectal bleeding.  She has been using VSL #3, which she feels has helped some.   Regarding her GERD, she was diagnosed with GERD for possibly 15 years. States currently she is having regurgitation at night and cough due to this. She usually has heartburn at night. It's happening 4/7 nights per week, unrelated to the type of food she eats. Patient takes Nexium 40 mg in AM, but has tried changing to the PM but has not found any difference. Takes Pepcid and Mylanta when she has heartburn/regurgitation issues. No odynophagia or dysphagia.  The patient denies having any nausea, vomiting, fever, chills, hematochezia, melena, hematemesis, jaundice, pruritus . Has been gaining weight recently.  Last EGD:01/09/2020 - Dr. Karilyn Cota Web found in the  proximal esophagus, intrinsic stenosis found at 31 cm from the incisura which was dilated with a Maloney dilator up to 54 [place] -stenosis was found at the GE junction.  Biopsies were taken from the Z-line (chronic inflammation without Barrett's esophagus).  4 cm hiatal hernia.  Normal stomach and duodenum.  Last Colonoscopy:01/09/2020 - Dr. Karilyn Cota, fair preparation Diminutive polyp in the hepatic flexure (tubular adenoma), normal colon, random biopsies were negative for microscopic colitis.  External hemorrhoids.  Recommended repeat colonoscopy in 5 years.  FHx: neg for any gastrointestinal/liver disease, mother colon cancer  in her 16s  and then diagnosed with breast cancer in her 48s Social: neg smoking, alcohol or illicit drug use Surgical: no abdominal surgeries  Past Medical History: Past Medical History:  Diagnosis Date   ADD (attention deficit disorder)    Adult   Allergic rhinitis    Anxiety    Depression    GERD (gastroesophageal reflux disease)    Hyperlipidemia    Insomnia    Chronic   MS (multiple sclerosis) (HCC)    Osteopenia    Positive H. pylori test    Reflux     Past Surgical History: Past Surgical History:  Procedure Laterality Date   BIOPSY  01/09/2020   Procedure: BIOPSY;  Surgeon: Malissa Hippo, MD;  [facility];  Service: Endoscopy;;   COLONOSCOPY     COLONOSCOPY N/A 10/12/2016   Procedure: COLONOSCOPY;  Surgeon: Malissa Hippo, MD;  [facility];  Service: Endoscopy;  Laterality: N/A;   COLONOSCOPY N/A 01/09/2020   Procedure: COLONOSCOPY;  Surgeon: Lionel December  U, MD;  [facility];  Service: Endoscopy;  Laterality: N/A;  100   Cyst removed from left breast     DILATION AND CURETTAGE OF UTERUS     ESOPHAGEAL DILATION N/A 01/09/2020   Procedure: ESOPHAGEAL DILATION;  Surgeon: Malissa Hippo, MD;  [facility];  Service: Endoscopy;  Laterality: N/A;   ESOPHAGOGASTRODUODENOSCOPY      ESOPHAGOGASTRODUODENOSCOPY N/A 10/12/2016   Procedure: ESOPHAGOGASTRODUODENOSCOPY (EGD);  Surgeon: Malissa Hippo, MD;  [facility];  Service: Endoscopy;  Laterality: N/A;  1:45   ESOPHAGOGASTRODUODENOSCOPY N/A 01/09/2020   Procedure: ESOPHAGOGASTRODUODENOSCOPY (EGD);  Surgeon: Malissa Hippo, MD;  [facility];  Service: Endoscopy;  Laterality: N/A;   POLYPECTOMY  10/12/2016   Procedure: POLYPECTOMY;  Surgeon: Malissa Hippo, MD;  [facility];  Service: Endoscopy;;  cecal    POLYPECTOMY  01/09/2020   Procedure: POLYPECTOMY;  Surgeon: Malissa Hippo, MD;  [facility];  Service: Endoscopy;;    Family History: Family History  Problem Relation Age of Onset   Cancer Mother        Colon   Colon cancer Mother    Hypertension Father     Social History: Social History   Tobacco Use  Smoking Status Never  Smokeless Tobacco Never   Social History   Substance and Sexual Activity  Alcohol Use Yes   Comment: Socially   Social History   Substance and Sexual Activity  Drug Use No    Allergies: Allergies  Allergen Reactions   Ambien [Zolpidem Tartrate] Other (See Comments)    forgetfulness    Etodolac Rash    Medications: Current Outpatient Medications  Medication Sig Dispense Refill   Calcium Carbonate Antacid (MAALOX) 600 MG chewable tablet Chew 600 mg by mouth at bedtime.     Cholecalciferol 50 MCG (2000 UT) TABS Take 2,000 Units by mouth daily.      clonazePAM (KLONOPIN) 0.5 MG tablet TAKE ONE TABLET BY MOUTH ONCE A DAY AS NEEDED FOR INSOMNIA 30 tablet 3   desvenlafaxine (PRISTIQ) 100 MG 24 hr tablet Take 1 tablet (100 mg total) by mouth daily. 90 tablet 3   diphenoxylate-atropine (LOMOTIL) 2.5-0.025 MG tablet TAKE ONE (1) TABLET BY MOUTH FOUR (4) TIMES DAILY AS NEEDED FOR DIARRHEA OR LOOSE STOOLS 30 tablet 3   doxepin (SINEQUAN) 50 MG capsule TAKE ONE CAPSULE (50 MG TOTAL) BY MOUTH AT BEDTIME. 90 capsule 3   esomeprazole  (NEXIUM) 40 MG capsule TAKE ONE CAPSULE (40MG  TOTAL) BY MOUTH DAILY BEFORE BREAKFAST 90 capsule 1   famotidine (PEPCID) 20 MG tablet Take 20 mg by mouth at bedtime.     Hyoscyamine Sulfate SL (LEVSIN/SL) 0.125 MG SUBL Place 0.125 mg under the tongue 2 (two) times daily as needed (abd cramping, diarrhea). 60 tablet 3   Ibuprofen-diphenhydrAMINE HCl (IBUPROFEN PM) 200-25 MG CAPS Take 2 tablets by mouth daily as needed (Headache/back pain).     Melatonin 300 MCG TABS Take 300 mcg by mouth at bedtime.     Probiotic Product (PROBIOTIC ADVANCED PO) Take by mouth daily at 6 (six) AM.     rosuvastatin (CRESTOR) 20 MG tablet TAKE ONE TABLET (20MG  TOTAL) BY MOUTH DAILY 90 tablet 3   tiZANidine (ZANAFLEX) 4 MG tablet TAKE ONE TABLET (4MG  TOTAL) BY MOUTH DAILY AS NEEDED FOR MUSCLE SPASMS 30 tablet 1   No current facility-administered medications for this visit.    Review of Systems: GENERAL: negative for malaise, night  sweats HEENT: No changes in hearing or vision, no nose bleeds or other nasal problems. NECK: Negative for lumps, goiter, pain and significant neck swelling RESPIRATORY: Negative for cough, wheezing CARDIOVASCULAR: Negative for chest pain, leg swelling, palpitations, orthopnea GI: SEE HPI MUSCULOSKELETAL: Negative for joint pain or swelling, back pain, and muscle pain. SKIN: Negative for lesions, rash PSYCH: Negative for sleep disturbance, mood disorder and recent psychosocial stressors. HEMATOLOGY Negative for prolonged bleeding, bruising easily, and swollen nodes. ENDOCRINE: Negative for cold or heat intolerance, polyuria, polydipsia and goiter. NEURO: negative for tremor, gait imbalance, syncope and seizures. The remainder of the review of systems is noncontributory.   Physical Exam: BP 125/86 (BP Location: Left Arm, Patient Position: Sitting, Cuff Size: Large)   Pulse (!) 106   Temp 97.8 F (36.6 C) (Temporal)   Ht 5\' 1"  (1.549 m)   Wt 170 lb 1.6 oz (77.2 kg)   BMI 32.14  kg/m  GENERAL: The patient is AO x3, in no acute distress. HEENT: Head is normocephalic and atraumatic. EOMI are intact. Mouth is well hydrated and without lesions. NECK: Supple. No masses LUNGS: Clear to auscultation. No presence of rhonchi/wheezing/rales. Adequate chest expansion HEART: RRR, normal s1 and s2. ABDOMEN: Soft, nontender, no guarding, no peritoneal signs, and nondistended. BS +. No masses. EXTREMITIES: Without any cyanosis, clubbing, rash, lesions or edema. NEUROLOGIC: AOx3, no focal motor deficit. SKIN: no jaundice, no rashes   Imaging/Labs: as above  I personally reviewed and interpreted the available labs, imaging and endoscopic files.  Impression and Plan: Jasmine Patton is a 72 y.o. female with PMH MS, ADHD, anxiety, depression, GERD, hyperlipidemia, osteopenia, who presents for evaluation of changes in bowel movement frequency and GERD.  Patient has presented a fluctuation in her bowel movements.  Based on her description of the symptoms, this seems to be related to overflow diarrhea.  She will benefit from improving her bowel movement frequency with MiraLAX.  If this fails to achieve symptom control/improvement, we will start prescription laxatives.  The patient and I held a thorough discussion about potential nonpharmacologic treatments for reflux such as combo transoral Incisionless Fundoplication (TIF).  The details of the procedure, benefits and risks, as well as prognosis with this intervention was thoroughly discussed with the patient who understood and agreed.  Dietary modifications and post procedural recommendations were also discussed the patient.  The patient will read more about this procedure.  For now, we will schedule an EGD with Bravo placement off PPI -can restart Nexium and Pepcid at the same dosage 3 days after undergoing EGD.  Will also schedule her for a barium esophagram with pill as part of the preprocedural evaluation for possible combo TIF  (patient had a 4 cm hiatal hernia in the past).  - Schedule EGD with Bravo placement with Dr. Tasia Catchings -Schedule barium esophagram with pill -Hold Nexium and Pepcid 2 weeks prior to EGD -Explained presumed etiology of reflux symptoms. Instruction provided in the use of antireflux medication - patient should take medication in the morning 30-45 minutes before eating breakfast. Discussed avoidance of eating within 2 hours of lying down to sleep and benefit of blocks to elevate head of bed. Also, will benefit from avoiding carbonated drinks/sodas or food that has tomatoes, spicy or greasy food. -Start taking Miralax 1 capful every day for one week. If bowel movements do not improve, increase to 2 capfuls every day. If after two weeks there is no improvement, increase to 2 capfuls in AM and one at night.  All questions were answered.      Katrinka Blazing, MD Gastroenterology and Hepatology [facility] Gastroenterology

## 2022-11-28 NOTE — Patient Instructions (Signed)
Schedule EGD with Bravo placement with Dr. Tasia Catchings, possibly September 20th Schedule barium esophagram with pill Hold Nexium and Pepcid 2 weeks prior to EGD Explained presumed etiology of reflux symptoms. Instruction provided in the use of antireflux medication - patient should take medication in the morning 30-45 minutes before eating breakfast. Discussed avoidance of eating within 2 hours of lying down to sleep and benefit of blocks to elevate head of bed. Also, will benefit from avoiding carbonated drinks/sodas or food that has tomatoes, spicy or greasy food. Start taking Miralax 1 capful every day for one week. If bowel movements do not improve, increase to 2 capfuls every day. If after two weeks there is no improvement, increase to 2 capfuls in AM and one at night. The patient and I held a thorough discussion about potential nonpharmacologic treatments for reflux such as combo transoral Incisionless Fundoplication (TIF).  The details of the procedure, benefits and risks, as well as prognosis with this intervention was thoroughly discussed with the patient who understood and agreed.  Dietary modifications and post procedural recommendations were also discussed the patient.  The patient will read more about this procedure

## 2022-11-29 ENCOUNTER — Telehealth (INDEPENDENT_AMBULATORY_CARE_PROVIDER_SITE_OTHER): Payer: Self-pay

## 2022-11-29 NOTE — Telephone Encounter (Signed)
Jasmine Frame, MD  Meredith Leeds, CMA Please send her the TIF pamphlet. Thanks   Mailed 11/29/2022

## 2022-11-30 ENCOUNTER — Ambulatory Visit (HOSPITAL_COMMUNITY): Payer: Medicare PPO

## 2022-12-01 ENCOUNTER — Encounter (INDEPENDENT_AMBULATORY_CARE_PROVIDER_SITE_OTHER): Payer: Self-pay | Admitting: Gastroenterology

## 2022-12-02 ENCOUNTER — Ambulatory Visit (HOSPITAL_COMMUNITY)
Admission: RE | Admit: 2022-12-02 | Discharge: 2022-12-02 | Disposition: A | Discharge status: Home / Self Care | Payer: Medicare PPO | Source: Ambulatory Visit | Attending: Gastroenterology | Admitting: Gastroenterology

## 2022-12-02 DIAGNOSIS — K449 Diaphragmatic hernia without obstruction or gangrene: Secondary | ICD-10-CM | POA: Diagnosis not present

## 2022-12-02 DIAGNOSIS — K219 Gastro-esophageal reflux disease without esophagitis: Secondary | ICD-10-CM | POA: Diagnosis not present

## 2022-12-05 NOTE — Progress Notes (Deleted)
[facility]  PATIENT: Jasmine Patton DOB: [birth year]  REFERRING DOCTOR OR PCP:  *** SOURCE: ***  _________________________________   HISTORICAL  CHIEF COMPLAINT:  No chief complaint on file.   HISTORY OF PRESENT ILLNESS:  ***   MS history (from Dr. Baldemar Friday note 01/18/2018):her initial symptoms were in October 1996, she developed numbness from her elbow down into her hand on the left side and then over about 5 days she had numbness on the entire left side with mild weakness, and some impairment of balance. Her evaluation at that time showed a normal brain MRI, and 2 lesions on her cervical spine, her spinal tap per report showed elevated IgG synthesis rate, and positive oligoclonal bands. She was subsequent he diagnosed with multiple sclerosis, she was treated with IV sterile avoids, she did improve but was left with mild deficits with left sided sensory changes and mild impairment of balance. She does not recall any specific relapses since 1996 but has had intermittent neurologic symptoms. 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   She was treated with Avonex in 1997, and then was switched to Requip and then to Betaseron, she continued on immunotherapy until 2009. She stopped Betaseron in 2009 due to significant injection site reactions and due to flulike symptoms. She has not had any definite clinical relapses since stopping the Betaseron. Her last MRI is from 2010 and at that time was stable. She reports that since 2009 she has had some mild and very slowly worsening changes in her balance. She reports that her left leg weakness has been stable, and reports that overall she can walk about the same distance over the last few years. She does not use any assistive devices on a regular basis.     Imaging: MRI of the brain 08/11/2015 report mentions T2/FLAIR  hyperintense foci in the hemispheres and cerebellum consistent with chronic demyelinating plaque.  Compared to an MRI from 2008, a couple foci were more conspicuous or new.  There were no enhancing lesions.  MRI of the cervical spine report 08/11/2015 showed a T2 hyperintense focus adjacent to C1-C2 posteriorly to the left.  Degenerative changes noted at C4-C5 causing mild spinal stenosis and mild to moderate foraminal narrowing.  C5-C6 causing mild to moderate spinal stenosis but no significant foraminal narrowing..  There are degenerative changes at C6-C7 and C7-T1  REVIEW OF SYSTEMS: Constitutional: No fevers, chills, sweats, or change in appetite Eyes: No visual changes, double vision, eye pain Ear, nose and throat: No hearing loss, ear pain, nasal congestion, sore throat Cardiovascular: No chest pain, palpitations Respiratory:  No shortness of breath at rest or with exertion.   No wheezes GastrointestinaI: No nausea, vomiting, diarrhea, abdominal pain, fecal incontinence Genitourinary:  No dysuria, urinary retention or frequency.  No nocturia. Musculoskeletal:  No neck pain, back pain Integumentary: No rash, pruritus, skin lesions Neurological: as above Psychiatric: No depression at this time.  No anxiety Endocrine: No palpitations, diaphoresis, change in appetite, change in weigh or increased thirst Hematologic/Lymphatic:  No anemia, purpura, petechiae. Allergic/Immunologic: No itchy/runny eyes, nasal congestion, recent allergic reactions, rashes  ALLERGIES: Allergies  Allergen Reactions   Ambien [Zolpidem Tartrate] Other (See Comments)    forgetfulness    Etodolac Rash    HOME MEDICATIONS:  Current Outpatient Medications:    Calcium Carbonate Antacid (MAALOX) 600 MG chewable tablet, Chew 600 mg by mouth at  bedtime., Disp: , Rfl:    Cholecalciferol 50 MCG (2000 UT) TABS, Take 2,000 Units by mouth daily. , Disp: , Rfl:    clonazePAM (KLONOPIN) 0.5 MG tablet, TAKE ONE TABLET BY  MOUTH ONCE A DAY AS NEEDED FOR INSOMNIA, Disp: 30 tablet, Rfl: 3   desvenlafaxine (PRISTIQ) 100 MG 24 hr tablet, Take 1 tablet (100 mg total) by mouth daily., Disp: 90 tablet, Rfl: 3   doxepin (SINEQUAN) 50 MG capsule, TAKE ONE CAPSULE (50 MG TOTAL) BY MOUTH AT BEDTIME., Disp: 90 capsule, Rfl: 3   esomeprazole (NEXIUM) 40 MG capsule, TAKE ONE CAPSULE (40MG  TOTAL) BY MOUTH DAILY BEFORE BREAKFAST, Disp: 90 capsule, Rfl: 1   famotidine (PEPCID) 20 MG tablet, Take 20 mg by mouth at bedtime., Disp: , Rfl:    Hyoscyamine Sulfate SL (LEVSIN/SL) 0.125 MG SUBL, Place 0.125 mg under the tongue 2 (two) times daily as needed (abd cramping, diarrhea)., Disp: 60 tablet, Rfl: 3   Ibuprofen-diphenhydrAMINE HCl (IBUPROFEN PM) 200-25 MG CAPS, Take 2 tablets by mouth daily as needed (Headache/back pain)., Disp: , Rfl:    Melatonin 300 MCG TABS, Take 300 mcg by mouth at bedtime., Disp: , Rfl:    Probiotic Product (PROBIOTIC ADVANCED PO), Take by mouth daily at 6 (six) AM., Disp: , Rfl:    rosuvastatin (CRESTOR) 20 MG tablet, TAKE ONE TABLET (20MG  TOTAL) BY MOUTH DAILY, Disp: 90 tablet, Rfl: 3   tiZANidine (ZANAFLEX) 4 MG tablet, TAKE ONE TABLET (4MG  TOTAL) BY MOUTH DAILY AS NEEDED FOR MUSCLE SPASMS, Disp: 30 tablet, Rfl: 1  PAST MEDICAL HISTORY: Past Medical History:  Diagnosis Date   ADD (attention deficit disorder)    Adult   Allergic rhinitis    Anxiety    Depression    GERD (gastroesophageal reflux disease)    Hyperlipidemia    Insomnia    Chronic   MS (multiple sclerosis) (HCC)    Osteopenia    Positive H. pylori test    Reflux     PAST SURGICAL HISTORY: Past Surgical History:  Procedure Laterality Date   BIOPSY  01/09/2020   Procedure: BIOPSY;  Surgeon: Malissa Hippo, MD;  [facility];  Service: Endoscopy;;   COLONOSCOPY     COLONOSCOPY N/A 10/12/2016   Procedure: COLONOSCOPY;  Surgeon: Malissa Hippo, MD;  [facility]: [facility];  Service: Endoscopy;  Laterality: N/A;    COLONOSCOPY N/A 01/09/2020   Procedure: COLONOSCOPY;  Surgeon: Malissa Hippo, MD;  [facility]: [facility];  Service: Endoscopy;  Laterality: N/A;  100   Cyst removed from left breast     DILATION AND CURETTAGE OF UTERUS     ESOPHAGEAL DILATION N/A 01/09/2020   Procedure: ESOPHAGEAL DILATION;  Surgeon: Malissa Hippo, MD;  [facility]: [facility];  Service: Endoscopy;  Laterality: N/A;   ESOPHAGOGASTRODUODENOSCOPY     ESOPHAGOGASTRODUODENOSCOPY N/A 10/12/2016   Procedure: ESOPHAGOGASTRODUODENOSCOPY (EGD);  Surgeon: Malissa Hippo, MD;  [facility]: [facility];  Service: Endoscopy;  Laterality: N/A;  1:45   ESOPHAGOGASTRODUODENOSCOPY N/A 01/09/2020   Procedure: ESOPHAGOGASTRODUODENOSCOPY (EGD);  Surgeon: Malissa Hippo, MD;  [facility]: [facility];  Service: Endoscopy;  Laterality: N/A;   POLYPECTOMY  10/12/2016   Procedure: POLYPECTOMY;  Surgeon: Malissa Hippo, MD;  [facility]: [facility];  Service: Endoscopy;;  cecal    POLYPECTOMY  01/09/2020   Procedure: POLYPECTOMY;  Surgeon: Malissa Hippo, MD;  [facility]: [facility];  Service: Endoscopy;;    FAMILY HISTORY: Family History  Problem Relation Age of Onset   Cancer Mother        Colon   Colon cancer Mother    Hypertension Father     SOCIAL HISTORY: Social History   Socioeconomic History   Marital status: Married    Spouse name: Not on file   Number of children: Not on file   Years of education: Not on file   Highest education level: Bachelor's degree (e.g., BA, AB, BS)  Occupational History   Not on file  Tobacco Use   Smoking status: Never   Smokeless tobacco: Never  Vaping Use   Vaping status: Never Used  Substance and Sexual Activity   Alcohol use: Yes    Comment: Socially   Drug use: No   Sexual activity: Not on file  Other Topics Concern   Not on file  Social History Narrative   Not on file   Social Determinants of Health   Financial Resource Strain: Low Risk  (11/02/2022)   Overall Financial  Resource Strain (CARDIA)    Difficulty of Paying Living Expenses: Not very hard  Food Insecurity: No Food Insecurity (11/02/2022)   Hunger Vital Sign    Worried About Running Out of Food in the Last Year: Never true    Ran Out of Food in the Last Year: Never true  Transportation Needs: No Transportation Needs (11/02/2022)   PRAPARE - Administrator, Civil Service (Medical): No    Lack of Transportation (Non-Medical): No  Physical Activity: Unknown (11/02/2022)   Exercise Vital Sign    Days of Exercise per Week: 0 days    Minutes of Exercise per Session: Not on file  Stress: Stress Concern Present (11/02/2022)   [employer] of Occupational Health - Occupational Stress Questionnaire    Feeling of Stress : To some extent  Social Connections: Moderately Integrated (11/02/2022)   Social Connection and Isolation Panel [NHANES]    Frequency of Communication with Friends and Family: More than three times a week    Frequency of Social Gatherings with Friends and Family: Twice a week    Attends Religious Services: 1 to 4 times per year    Active Member of [employer] or Organizations: No    Attends Engineer, structural: Not on file    Marital Status: Married  Catering manager Violence: Not on file       PHYSICAL EXAM  There were no vitals filed for this visit.  There is no height or weight on file to calculate BMI.   General: The patient is well-developed and well-nourished and in no acute distress  HEENT:  Head is [place]/AT.  Sclera are anicteric.  Funduscopic exam shows normal optic discs and retinal vessels.  Neck: No carotid bruits are noted.  The neck is nontender.  Cardiovascular: The heart has a regular rate and rhythm with a normal S1 and S2. There were no murmurs, gallops or rubs.    Skin: Extremities are without rash or  edema.  Musculoskeletal:  Back is nontender  Neurologic Exam  Mental status: The patient is alert and oriented x 3 at the time of the  examination. The patient has apparent normal recent and remote memory, with an apparently normal attention span and concentration ability.   Speech is normal.  Cranial nerves: Extraocular movements are full. Pupils are equal, round, and reactive to light and accomodation.  Visual fields are full.  Facial symmetry is present. There is good facial sensation to soft touch bilaterally.Facial strength  is normal.  Trapezius and sternocleidomastoid strength is normal. No dysarthria is noted.  The tongue is midline, and the patient has symmetric elevation of the soft palate. No obvious hearing deficits are noted.  Motor:  Muscle bulk is normal.   Tone is normal. Strength is  5 / 5 in all 4 extremities.   Sensory: Sensory testing is intact to pinprick, soft touch and vibration sensation in all 4 extremities.  Coordination: Cerebellar testing reveals good finger-nose-finger and heel-to-shin bilaterally.  Gait and station: Station is normal.   Gait is normal. Tandem gait is normal. Romberg is negative.   Reflexes: Deep tendon reflexes are symmetric and normal bilaterally.   Plantar responses are flexor.    DIAGNOSTIC DATA (LABS, IMAGING, TESTING) - I reviewed patient records, labs, notes, testing and imaging myself where available.  Lab Results  Component Value Date   WBC 7.3 11/15/2022   HGB 12.6 11/15/2022   HCT 38.4 11/15/2022   MCV 92 11/15/2022   PLT 341 11/15/2022      Component Value Date/Time   NA 140 11/15/2022 1106   K 4.6 11/15/2022 1106   CL 102 11/15/2022 1106   CO2 25 11/15/2022 1106   GLUCOSE 125 (H) 11/15/2022 1106   GLUCOSE 91 03/18/2014 1025   BUN 10 11/15/2022 1106   CREATININE 0.99 11/15/2022 1106   CREATININE 0.78 03/18/2014 1025   CALCIUM 9.7 11/15/2022 1106   PROT 7.0 11/15/2022 1106   ALBUMIN 4.4 11/15/2022 1106   AST 19 11/15/2022 1106   ALT 12 11/15/2022 1106   ALKPHOS 73 11/15/2022 1106   BILITOT 0.3 11/15/2022 1106   GFRNONAA 70 04/14/2020 1122   GFRAA  80 04/14/2020 1122   Lab Results  Component Value Date   CHOL 170 11/15/2022   HDL 62 11/15/2022   LDLCALC 78 11/15/2022   TRIG 178 (H) 11/15/2022   CHOLHDL 2.7 11/15/2022   Lab Results  Component Value Date   HGBA1C 5.7 (H) 11/15/2022   No results found for: "VITAMINB12" Lab Results  Component Value Date   TSH 1.920 11/15/2022       ASSESSMENT AND PLAN  ***   Navaeh Kehres A. Epimenio Foot, MD, [facility] 12/05/2022, 4:50 PM Certified in Neurology, Clinical Neurophysiology, Sleep Medicine and Neuroimaging  [facility] [address] [place], [place] [address] [phone]

## 2022-12-08 ENCOUNTER — Ambulatory Visit: Payer: Medicare PPO | Admitting: Neurology

## 2022-12-13 ENCOUNTER — Telehealth (INDEPENDENT_AMBULATORY_CARE_PROVIDER_SITE_OTHER): Payer: Self-pay

## 2022-12-13 NOTE — Telephone Encounter (Signed)
Pt contacted and advised that we would need to cancel. Pt would need to be rescheduled 14 days out. Will call pt with October schedule to get her rescheduled. (EGD w/Bravo asa 1-2 Ahmed)

## 2022-12-13 NOTE — Telephone Encounter (Signed)
Thanks agree, can schedule for Oct

## 2022-12-13 NOTE — Telephone Encounter (Signed)
Patient called today stating she has an upcoming EGD to be done by Dr. Tasia Catchings on 12/23/2022. Patient concerned she may have to cancel this as she had been feeling bad last week and tested herself for Covid on 12/10/2022 and she was positive. Patient says she is pretty much asymptomatic now,but rechecked her test and was still positive today and wanted to make sure with [place] that she did not need to cancel the upcoming Egd that is scheduled for 12/23/2022. Please advise if patient is ok to keep the current appointment or if she needs to reschedule. Thanks,

## 2022-12-22 ENCOUNTER — Encounter (INDEPENDENT_AMBULATORY_CARE_PROVIDER_SITE_OTHER): Payer: Self-pay

## 2022-12-22 ENCOUNTER — Telehealth (INDEPENDENT_AMBULATORY_CARE_PROVIDER_SITE_OTHER): Payer: Self-pay | Admitting: Gastroenterology

## 2022-12-22 NOTE — Telephone Encounter (Signed)
Lindajo Royal, RN  Marlowe Shores, LPN Can you please put her on 10/15? We will be having the rep come again to train staff.       Previous Messages    ----- Message ----- From: Marlowe Shores, LPN Sent: 9/52/8413  11:10 AM EDT To: Lindajo Royal, RN  I put this pt on for 01/04/23 for EGD w/BRAVO with Dr.Ahmed    Rescheduled pt to 01/17/23 and sent updated instructions via my chart

## 2022-12-22 NOTE — Telephone Encounter (Signed)
Pt contacted and rescheduled for 01/04/23 at 1:45pm. Instructions sent via my chart.

## 2022-12-23 ENCOUNTER — Ambulatory Visit (HOSPITAL_COMMUNITY): Admit: 2022-12-23 | Payer: Medicare PPO

## 2022-12-23 ENCOUNTER — Encounter (HOSPITAL_COMMUNITY): Payer: Self-pay

## 2022-12-23 SURGERY — ESOPHAGOGASTRODUODENOSCOPY (EGD) WITH PROPOFOL
Anesthesia: Monitor Anesthesia Care

## 2022-12-27 ENCOUNTER — Encounter (INDEPENDENT_AMBULATORY_CARE_PROVIDER_SITE_OTHER): Payer: Self-pay

## 2022-12-27 NOTE — Telephone Encounter (Signed)
Message from Endo stating they can only do Bravos 2 per week. Contacted pt to ask if she was ok to reschedule to 01/24/23 at8:30. Pt agreeable to switch. Updated instructions sent via my chart.

## 2023-01-02 DIAGNOSIS — H5213 Myopia, bilateral: Secondary | ICD-10-CM | POA: Diagnosis not present

## 2023-01-02 DIAGNOSIS — H25012 Cortical age-related cataract, left eye: Secondary | ICD-10-CM | POA: Diagnosis not present

## 2023-01-02 DIAGNOSIS — H2512 Age-related nuclear cataract, left eye: Secondary | ICD-10-CM | POA: Diagnosis not present

## 2023-01-02 DIAGNOSIS — H26491 Other secondary cataract, right eye: Secondary | ICD-10-CM | POA: Diagnosis not present

## 2023-01-04 ENCOUNTER — Other Ambulatory Visit: Payer: Self-pay | Admitting: Family Medicine

## 2023-01-07 NOTE — Progress Notes (Unsigned)
[facility]  PATIENT: Jasmine Patton DOB: 1950/12/09  REFERRING DOCTOR OR PCP:  Dr. Everlene Other, DO; Sherie Don, NP SOURCE: Patient, records from [facility] neurology, imaging and lab reports, MRI images have been requested.  _________________________________   HISTORICAL  CHIEF COMPLAINT:  Chief Complaint  Patient presents with   Room 10    Pt is here with her Husband. Pt states that she has some Fatigue. Pt states that she has numbness in both legs. Pt states that she has memory issues and brain fog. Pt states that she has trouble sleeping at night. Pt states that she would like to discuss getting back on Adderall. Pt states that she is having some depression. Pt states that her balance is off. Pt states that her body stays hot. Pt states that she thinks she has IBS.     HISTORY OF PRESENT ILLNESS:  I had the pleasure of seeing your patient, Jasmine Patton, at the MS center at [facility] for neurologic consultation regarding her multiple sclerosis.  She is a 72 year old woman who was diagnosed with MS in 1996.    She is not currently on any disease modifying therapy.  She last took Betaseron in 2009.  MS history (modified from Dr. Quintella Baton Zeid's note 01/18/2018):  Her initial symptoms were in October 1996, she developed numbness from her elbow down into her hand on the left side progressing over several days she had numbness on the entire left side with mild weakness, and some impairment of balance. She had a Lhermtte sign.   Her evaluation at that time showed a normal brain MRI, and 2 lesions seen on her cervical spine, her spinal tap per report showed elevated IgG synthesis rate, and positive oligoclonal bands. She was subsequent he diagnosed with multiple sclerosis, she was treated with IV steroids, she did improve but was left with mild deficits with left sided sensory changes and mild impairment of balance. She also had a lot of fatigue.    She does not recall any specific relapses since 1996 but has had intermittent neurologic symptoms.   She was treated with Avonex in 1997, and then was switched to Rebif and then to Betaseron, she continued on immunotherapy until 2009. She stopped Betaseron in 2009 due to significant injection site reactions and due to flulike symptoms. She has not had any definite clinical relapses since stopping the Betaseron. Her last MRI is from 2010 and at that time was stable. She reports that since 2009 she has had some mild and very slowly worsening changes in her balance. She reports that her left leg weakness has been stable, and reports that overall she can walk about the same distance over the last few years. She does not use any assistive devices on a regular basis.   Currently, she has noted some issues with gait.   She does not need a cane but is of balanced.  She has difficulty with stairs and uses the rails.   She has no recent hard fall but has some stumbles.   She has left > right weakness and numbness.  Denies spasticity.  She has mild urge incontinence and bowel incontinence.     She has cataract affecting her vision.  She never had optic neuritis  Since her initial exacerbation, she has had significant fatigue.  Over the years she also has had more difficulty with sleep.  She has developed a delayed phase.    She reads at night and does  emails on phone.   She goes to bed around midnight or later and usually wakes up around 9-11 am.    He also was times take naps during the day.  In the past she was on Adderall 10 mg as needed and took sparingly.  This helped her on days where she needed to focus more or when she had more tiredness.  She had depression in the past and is currently on Pristiq.  She has not had mood issues for a while and we discussed discontinuing the medication.  Cognition has done well.   Imaging: MRI of the brain 08/11/2015 report mentions T2/FLAIR hyperintense foci in the hemispheres  and cerebellum consistent with chronic demyelinating plaque.  Compared to an MRI from 2008, a couple foci were more conspicuous or new.  There were no enhancing lesions.  MRI of the cervical spine report 08/11/2015 showed a T2 hyperintense focus adjacent to C1-C2 posteriorly to the left.  Degenerative changes noted at C4-C5 causing mild spinal stenosis and mild to moderate foraminal narrowing.  C5-C6 causing mild to moderate spinal stenosis but no significant foraminal narrowing..  There are degenerative changes at C6-C7 and C7-T1  REVIEW OF SYSTEMS: Constitutional: No fevers, chills, sweats, or change in appetite Eyes: No visual changes, double vision, eye pain Ear, nose and throat: No hearing loss, ear pain, nasal congestion, sore throat Cardiovascular: No chest pain, palpitations Respiratory:  No shortness of breath at rest or with exertion.   No wheezes GastrointestinaI: No nausea, vomiting, diarrhea, abdominal pain, fecal incontinence Genitourinary:  No dysuria, urinary retention or frequency.  No nocturia. Musculoskeletal:  No neck pain, back pain Integumentary: No rash, pruritus, skin lesions Neurological: as above Psychiatric: No depression at this time.  No anxiety Endocrine: No palpitations, diaphoresis, change in appetite, change in weigh or increased thirst Hematologic/Lymphatic:  No anemia, purpura, petechiae. Allergic/Immunologic: No itchy/runny eyes, nasal congestion, recent allergic reactions, rashes  ALLERGIES: Allergies  Allergen Reactions   Ambien [Zolpidem Tartrate] Other (See Comments)    forgetfulness    Etodolac Rash    HOME MEDICATIONS:  Current Outpatient Medications:    amphetamine-dextroamphetamine (ADDERALL) 10 MG tablet, Take 1 tablet once or twice a day as needed, Disp: 60 tablet, Rfl: 0   Calcium Carbonate Antacid (MAALOX) 600 MG chewable tablet, Chew 600 mg by mouth at bedtime., Disp: , Rfl:    Cholecalciferol 50 MCG (2000 UT) TABS, Take 2,000 Units by  mouth daily. , Disp: , Rfl:    clonazePAM (KLONOPIN) 0.5 MG tablet, TAKE ONE TABLET BY MOUTH ONCE A DAY AS NEEDED FOR INSOMNIA, Disp: 30 tablet, Rfl: 3   doxepin (SINEQUAN) 50 MG capsule, TAKE ONE CAPSULE (50 MG TOTAL) BY MOUTH AT BEDTIME., Disp: 90 capsule, Rfl: 3   Ibuprofen-diphenhydrAMINE HCl (IBUPROFEN PM) 200-25 MG CAPS, Take 2 tablets by mouth daily as needed (Headache/back pain)., Disp: , Rfl:    Melatonin 300 MCG TABS, Take 300 mcg by mouth at bedtime., Disp: , Rfl:    Probiotic Product (PROBIOTIC ADVANCED PO), Take by mouth daily at 6 (six) AM., Disp: , Rfl:    rosuvastatin (CRESTOR) 20 MG tablet, TAKE ONE TABLET (20MG  TOTAL) BY MOUTH DAILY, Disp: 90 tablet, Rfl: 3   tiZANidine (ZANAFLEX) 4 MG tablet, TAKE ONE TABLET (4MG  TOTAL) BY MOUTH DAILY AS NEEDED FOR MUSCLE SPASMS, Disp: 30 tablet, Rfl: 1   desvenlafaxine (PRISTIQ) 25 MG 24 hr tablet, Take 3 po every day x 1 week, then 2 po  every day x 1  week, then 1 po every day x 1 week, then discontinue, Disp: 42 tablet, Rfl: 0   esomeprazole (NEXIUM) 40 MG capsule, TAKE ONE CAPSULE (40MG  TOTAL) BY MOUTH DAILY BEFORE BREAKFAST, Disp: 90 capsule, Rfl: 1   famotidine (PEPCID) 20 MG tablet, Take 20 mg by mouth at bedtime., Disp: , Rfl:    Hyoscyamine Sulfate SL (LEVSIN/SL) 0.125 MG SUBL, Place 0.125 mg under the tongue 2 (two) times daily as needed (abd cramping, diarrhea)., Disp: 60 tablet, Rfl: 3  PAST MEDICAL HISTORY: Past Medical History:  Diagnosis Date   ADD (attention deficit disorder)    Adult   Allergic rhinitis    Anxiety    Depression    GERD (gastroesophageal reflux disease)    Hyperlipidemia    Insomnia    Chronic   MS (multiple sclerosis) (HCC)    Osteopenia    Positive H. pylori test    Reflux     PAST SURGICAL HISTORY: Past Surgical History:  Procedure Laterality Date   BIOPSY  01/09/2020   Procedure: BIOPSY;  Surgeon: Malissa Hippo, MD;  [facility];  Service: Endoscopy;;   COLONOSCOPY      COLONOSCOPY N/A 10/12/2016   Procedure: COLONOSCOPY;  Surgeon: Malissa Hippo, MD;  [facility];  Service: Endoscopy;  Laterality: N/A;   COLONOSCOPY N/A 01/09/2020   Procedure: COLONOSCOPY;  Surgeon: Malissa Hippo, MD;  [facility];  Service: Endoscopy;  Laterality: N/A;  100   Cyst removed from left breast     DILATION AND CURETTAGE OF UTERUS     ESOPHAGEAL DILATION N/A 01/09/2020   Procedure: ESOPHAGEAL DILATION;  Surgeon: Malissa Hippo, MD;  [facility];  Service: Endoscopy;  Laterality: N/A;   ESOPHAGOGASTRODUODENOSCOPY     ESOPHAGOGASTRODUODENOSCOPY N/A 10/12/2016   Procedure: ESOPHAGOGASTRODUODENOSCOPY (EGD);  Surgeon: Malissa Hippo, MD;  [facility];  Service: Endoscopy;  Laterality: N/A;  1:45   ESOPHAGOGASTRODUODENOSCOPY N/A 01/09/2020   Procedure: ESOPHAGOGASTRODUODENOSCOPY (EGD);  Surgeon: Malissa Hippo, MD;  [facility];  Service: Endoscopy;  Laterality: N/A;   POLYPECTOMY  10/12/2016   Procedure: POLYPECTOMY;  Surgeon: Malissa Hippo, MD;  [facility];  Service: Endoscopy;;  cecal    POLYPECTOMY  01/09/2020   Procedure: POLYPECTOMY;  Surgeon: Malissa Hippo, MD;  [facility];  Service: Endoscopy;;    FAMILY HISTORY: Family History  Problem Relation Age of Onset   Cancer Mother        Colon   Colon cancer Mother    Hypertension Father     SOCIAL HISTORY: Social History   Socioeconomic History   Marital status: Married    Spouse name: Not on file   Number of children: Not on file   Years of education: Not on file   Highest education level: Bachelor's degree (e.g., BA, AB, BS)  Occupational History   Not on file  Tobacco Use   Smoking status: Never   Smokeless tobacco: Never  Vaping Use   Vaping status: Never Used  Substance and Sexual Activity   Alcohol use: Yes    Comment: Socially   Drug use: No   Sexual activity: Not on file  Other Topics Concern   Not on  file  Social History Narrative   Not on file   Social Determinants of Health   Financial Resource Strain: Low Risk  (11/02/2022)   Overall Financial Resource Strain (CARDIA)    Difficulty of  Paying Living Expenses: Not very hard  Food Insecurity: No Food Insecurity (11/02/2022)   Hunger Vital Sign    Worried About Running Out of Food in the Last Year: Never true    Ran Out of Food in the Last Year: Never true  Transportation Needs: No Transportation Needs (11/02/2022)   PRAPARE - Administrator, Civil Service (Medical): No    Lack of Transportation (Non-Medical): No  Physical Activity: Unknown (11/02/2022)   Exercise Vital Sign    Days of Exercise per Week: 0 days    Minutes of Exercise per Session: Not on file  Stress: Stress Concern Present (11/02/2022)   [employer] of Occupational Health - Occupational Stress Questionnaire    Feeling of Stress : To some extent  Social Connections: Moderately Integrated (11/02/2022)   Social Connection and Isolation Panel [NHANES]    Frequency of Communication with Friends and Family: More than three times a week    Frequency of Social Gatherings with Friends and Family: Twice a week    Attends Religious Services: 1 to 4 times per year    Active Member of [employer] or Organizations: No    Attends Engineer, structural: Not on file    Marital Status: Married  Catering manager Violence: Not on file       PHYSICAL EXAM  Vitals:   01/10/23 1328  BP: (!) 149/85  Pulse: (!) 101  Weight: 168 lb (76.2 kg)  Height: 5\' 1"  (1.549 m)    Body mass index is 31.74 kg/m.   General: The patient is well-developed and well-nourished and in no acute distress  HEENT:  Head is [place]/AT.  Sclera are anicteric.   Neck: No carotid bruits are noted.  The neck is nontender.  Cardiovascular: The heart has a regular rate and rhythm with a normal S1 and S2. There were no murmurs, gallops or rubs.    Skin: Extremities are without rash or  edema.  Musculoskeletal:  Back is nontender  Neurologic Exam  Mental status: The patient is alert and oriented x 3 at the time of the examination. The patient has apparent normal recent and remote memory, with an apparently normal attention span and concentration ability.   Speech is normal.  Cranial nerves: Extraocular movements are full. Pupils are equal, round, and reactive to light and accomodation.  Color vision is symmetric though OS is more blurry.  Facial symmetry is present. There is good facial sensation to soft touch bilaterally.Facial strength is normal.  Trapezius and sternocleidomastoid strength is normal. No dysarthria is noted.  The tongue is midline, and the patient has symmetric elevation of the soft palate. No obvious hearing deficits are noted.  Motor:  Muscle bulk is normal.   Tone is normal. Strength is  5 / 5 in all 4 extremities except 4+/5 left foot/toe extensors.   Sensory: Sensory testing is intact to pinprick, soft touch and vibration sensation in arms and slight temperature decrease on right vs. Left leg.  Vibration is symmetric in legs.  .  Coordination: Cerebellar testing reveals good finger-nose-finger and heel-to-shin bilaterally.  Gait and station: Station is normal.   Gait is slightly wide and has mild left foot drop. Tandem gait is mildly wide. Romberg is negative.   Reflexes: Deep tendon reflexes are symmetric and normal bilaterally.   Plantar responses are flexor.    DIAGNOSTIC DATA (LABS, IMAGING, TESTING) - I reviewed patient records, labs, notes, testing and imaging myself where available.  Lab Results  Component Value Date   WBC 7.3 11/15/2022   HGB 12.6 11/15/2022   HCT 38.4 11/15/2022   MCV 92 11/15/2022   PLT 341 11/15/2022      Component Value Date/Time   NA 140 11/15/2022 1106   K 4.6 11/15/2022 1106   CL 102 11/15/2022 1106   CO2 25 11/15/2022 1106   GLUCOSE 125 (H) 11/15/2022 1106   GLUCOSE 91 03/18/2014 1025   BUN 10  11/15/2022 1106   CREATININE 0.99 11/15/2022 1106   CREATININE 0.78 03/18/2014 1025   CALCIUM 9.7 11/15/2022 1106   PROT 7.0 11/15/2022 1106   ALBUMIN 4.4 11/15/2022 1106   AST 19 11/15/2022 1106   ALT 12 11/15/2022 1106   ALKPHOS 73 11/15/2022 1106   BILITOT 0.3 11/15/2022 1106   GFRNONAA 70 04/14/2020 1122   GFRAA 80 04/14/2020 1122   Lab Results  Component Value Date   CHOL 170 11/15/2022   HDL 62 11/15/2022   LDLCALC 78 11/15/2022   TRIG 178 (H) 11/15/2022   CHOLHDL 2.7 11/15/2022   Lab Results  Component Value Date   HGBA1C 5.7 (H) 11/15/2022   No results found for: "VITAMINB12" Lab Results  Component Value Date   TSH 1.920 11/15/2022       ASSESSMENT AND PLAN  Multiple sclerosis (HCC)  Anxiety and depression - Plan: desvenlafaxine (PRISTIQ) 25 MG 24 hr tablet  Primary insomnia  Overflow diarrhea  Urge incontinence  Delayed sleep phase syndrome  Other fatigue   In summary, she is a 72 year old woman who was diagnosed with MS and 1996 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  She has had some fluctuating symptoms since that time but no major exacerbation.  She was on medications for her first 13 years but has not been on Betaseron since 2009.  She has been stable with no recent exacerbations.  Her last MRI is from 2017 and was stable compared to previous MRI.   She will remain off a DMT.    Taper off the Pristiq.    Low dose Adderall once or twice a day prn.    Try to advance sleep by 60-90 minutes slowly over 2-3 weeks, enforced with melatonin 60-90 minutes before sleep and bright lights in the am. Rtc 12 months or sooner if new or worsening neurologic issues.     Thank you for asking me to see this patient.  Please let me know if I can be of further assistance with her or other patients in the future.  This visit is part of a comprehensive longitudinal care medical relationship regarding the patients primary diagnosis of MS and related  concerns.  Odilia Damico A. Epimenio Foot, MD, [facility] 01/10/2023, 2:39 PM Certified in Neurology, Clinical Neurophysiology, Sleep Medicine and Neuroimaging  [facility] [address] [place], [place] [address] [phone]

## 2023-01-10 ENCOUNTER — Ambulatory Visit: Payer: Medicare PPO | Admitting: Neurology

## 2023-01-10 ENCOUNTER — Encounter: Payer: Self-pay | Admitting: Neurology

## 2023-01-10 VITALS — BP 149/85 | HR 101 | Ht 61.0 in | Wt 168.0 lb

## 2023-01-10 DIAGNOSIS — F419 Anxiety disorder, unspecified: Secondary | ICD-10-CM

## 2023-01-10 DIAGNOSIS — F32A Depression, unspecified: Secondary | ICD-10-CM

## 2023-01-10 DIAGNOSIS — R5383 Other fatigue: Secondary | ICD-10-CM | POA: Diagnosis not present

## 2023-01-10 DIAGNOSIS — N3941 Urge incontinence: Secondary | ICD-10-CM | POA: Diagnosis not present

## 2023-01-10 DIAGNOSIS — R197 Diarrhea, unspecified: Secondary | ICD-10-CM

## 2023-01-10 DIAGNOSIS — G4721 Circadian rhythm sleep disorder, delayed sleep phase type: Secondary | ICD-10-CM | POA: Diagnosis not present

## 2023-01-10 DIAGNOSIS — F5101 Primary insomnia: Secondary | ICD-10-CM

## 2023-01-10 DIAGNOSIS — G35D Multiple sclerosis, unspecified: Secondary | ICD-10-CM

## 2023-01-10 DIAGNOSIS — G35 Multiple sclerosis: Secondary | ICD-10-CM | POA: Diagnosis not present

## 2023-01-10 MED ORDER — AMPHETAMINE-DEXTROAMPHETAMINE 10 MG PO TABS: ORAL_TABLET | ORAL | 0 refills | Status: DC

## 2023-01-10 MED ORDER — DESVENLAFAXINE SUCCINATE ER 25 MG PO TB24
ORAL_TABLET | ORAL | 0 refills | Status: DC
Start: 2023-01-10 — End: 2023-06-20

## 2023-01-12 ENCOUNTER — Other Ambulatory Visit (HOSPITAL_COMMUNITY): Payer: Medicare PPO

## 2023-01-17 ENCOUNTER — Ambulatory Visit (HOSPITAL_COMMUNITY): Admit: 2023-01-17 | Payer: Medicare PPO

## 2023-01-17 ENCOUNTER — Encounter (HOSPITAL_COMMUNITY): Payer: Self-pay

## 2023-01-17 SURGERY — ESOPHAGOGASTRODUODENOSCOPY (EGD) WITH PROPOFOL
Anesthesia: Monitor Anesthesia Care

## 2023-01-20 ENCOUNTER — Telehealth: Payer: Self-pay

## 2023-01-20 ENCOUNTER — Other Ambulatory Visit (HOSPITAL_COMMUNITY): Payer: Self-pay

## 2023-01-20 NOTE — Telephone Encounter (Signed)
*  [facility]  Pharmacy Patient Advocate Encounter   Received notification from CoverMyMeds that prior authorization for Desvenlafaxine Succinate ER 25MG  er tablets  is required/requested.   Insurance verification completed.   The patient is insured through [employer] .   Per test claim: PA required; PA submitted to [facility] via CoverMyMeds Key/confirmation #/EOC [id number] Status is pending

## 2023-01-20 NOTE — Telephone Encounter (Signed)
Pharmacy Patient Advocate Encounter  Received notification from [facility] that Prior Authorization for Desvenlafaxine Succinate ER 25MG  er tablets  has been APPROVED from 01/20/2023 to 04/02/2024. Ran test claim, Copay is $refill too soon. This test claim was processed through [facility]- copay amounts may vary at other pharmacies due to pharmacy/plan contracts, or as the patient moves through the different stages of their insurance plan.

## 2023-01-23 NOTE — Anesthesia Preprocedure Evaluation (Signed)
Anesthesia Evaluation  Patient identified by MRN, date of birth, ID band Patient awake    Reviewed: Allergy & Precautions, H&P , NPO status , Patient's Chart, lab work & pertinent test results, reviewed documented beta blocker date and time   Airway Mallampati: II  TM Distance: >3 FB Neck ROM: full    Dental   Pulmonary neg pulmonary ROS          Cardiovascular Exercise Tolerance: Good negative cardio ROS      Neuro/Psych  PSYCHIATRIC DISORDERS Anxiety Depression    Multiple sclerosis  Neuromuscular disease    GI/Hepatic Neg liver ROS, hiatal hernia,GERD  ,,  Endo/Other  negative endocrine ROS    Renal/GU negative Renal ROS  negative genitourinary   Musculoskeletal   Abdominal   Peds  Hematology negative hematology ROS (+)   Anesthesia Other Findings   Reproductive/Obstetrics negative OB ROS                             Anesthesia Physical Anesthesia Plan  ASA: 3  Anesthesia Plan: General   Post-op Pain Management: Minimal or no pain anticipated   Induction: Intravenous  PONV Risk Score and Plan:   Airway Management Planned: Nasal Cannula and Natural Airway  Additional Equipment: None  Intra-op Plan:   Post-operative Plan:   Informed Consent: I have reviewed the patients History and Physical, chart, labs and discussed the procedure including the risks, benefits and alternatives for the proposed anesthesia with the patient or authorized representative who has indicated his/her understanding and acceptance.     Dental Advisory Given  Plan Discussed with: CRNA  Anesthesia Plan Comments:        Anesthesia Quick Evaluation

## 2023-01-24 ENCOUNTER — Telehealth (INDEPENDENT_AMBULATORY_CARE_PROVIDER_SITE_OTHER): Payer: Self-pay | Admitting: *Deleted

## 2023-01-24 ENCOUNTER — Encounter: Payer: Self-pay | Admitting: Nurse Practitioner

## 2023-01-24 ENCOUNTER — Ambulatory Visit (HOSPITAL_COMMUNITY): Payer: Self-pay | Admitting: Anesthesiology

## 2023-01-24 ENCOUNTER — Other Ambulatory Visit (INDEPENDENT_AMBULATORY_CARE_PROVIDER_SITE_OTHER): Payer: Self-pay | Admitting: Gastroenterology

## 2023-01-24 ENCOUNTER — Encounter (HOSPITAL_COMMUNITY)
Admission: RE | Disposition: A | Discharge status: Home / Self Care | Payer: Self-pay | Source: Home / Self Care | Attending: Gastroenterology

## 2023-01-24 ENCOUNTER — Ambulatory Visit (HOSPITAL_COMMUNITY)
Admission: RE | Admit: 2023-01-24 | Discharge: 2023-01-24 | Disposition: A | Discharge status: Home / Self Care | Payer: Medicare PPO | Attending: Gastroenterology | Admitting: Gastroenterology

## 2023-01-24 ENCOUNTER — Other Ambulatory Visit: Payer: Self-pay

## 2023-01-24 DIAGNOSIS — F419 Anxiety disorder, unspecified: Secondary | ICD-10-CM | POA: Insufficient documentation

## 2023-01-24 DIAGNOSIS — K209 Esophagitis, unspecified without bleeding: Secondary | ICD-10-CM

## 2023-01-24 DIAGNOSIS — F32A Depression, unspecified: Secondary | ICD-10-CM | POA: Diagnosis not present

## 2023-01-24 DIAGNOSIS — K2100 Gastro-esophageal reflux disease with esophagitis, without bleeding: Secondary | ICD-10-CM

## 2023-01-24 DIAGNOSIS — Z79899 Other long term (current) drug therapy: Secondary | ICD-10-CM | POA: Insufficient documentation

## 2023-01-24 DIAGNOSIS — F909 Attention-deficit hyperactivity disorder, unspecified type: Secondary | ICD-10-CM | POA: Insufficient documentation

## 2023-01-24 DIAGNOSIS — K219 Gastro-esophageal reflux disease without esophagitis: Secondary | ICD-10-CM | POA: Insufficient documentation

## 2023-01-24 DIAGNOSIS — K2970 Gastritis, unspecified, without bleeding: Secondary | ICD-10-CM

## 2023-01-24 DIAGNOSIS — K3189 Other diseases of stomach and duodenum: Secondary | ICD-10-CM | POA: Insufficient documentation

## 2023-01-24 DIAGNOSIS — K2210 Ulcer of esophagus without bleeding: Secondary | ICD-10-CM | POA: Diagnosis not present

## 2023-01-24 DIAGNOSIS — G35 Multiple sclerosis: Secondary | ICD-10-CM | POA: Diagnosis not present

## 2023-01-24 DIAGNOSIS — F413 Other mixed anxiety disorders: Secondary | ICD-10-CM

## 2023-01-24 DIAGNOSIS — K449 Diaphragmatic hernia without obstruction or gangrene: Secondary | ICD-10-CM | POA: Diagnosis not present

## 2023-01-24 DIAGNOSIS — E785 Hyperlipidemia, unspecified: Secondary | ICD-10-CM | POA: Insufficient documentation

## 2023-01-24 DIAGNOSIS — R194 Change in bowel habit: Secondary | ICD-10-CM | POA: Diagnosis not present

## 2023-01-24 DIAGNOSIS — K582 Mixed irritable bowel syndrome: Secondary | ICD-10-CM

## 2023-01-24 DIAGNOSIS — M8580 Other specified disorders of bone density and structure, unspecified site: Secondary | ICD-10-CM | POA: Insufficient documentation

## 2023-01-24 HISTORY — PX: BIOPSY: SHX5522

## 2023-01-24 HISTORY — PX: BRAVO PH STUDY: SHX5421

## 2023-01-24 HISTORY — PX: ESOPHAGOGASTRODUODENOSCOPY (EGD) WITH PROPOFOL: SHX5813

## 2023-01-24 SURGERY — ESOPHAGOGASTRODUODENOSCOPY (EGD) WITH PROPOFOL
Anesthesia: General

## 2023-01-24 MED ORDER — LIDOCAINE HCL (CARDIAC) PF 100 MG/5ML IV SOSY
PREFILLED_SYRINGE | INTRAVENOUS | Status: DC | PRN
  Administered 2023-01-24: 100 mg via INTRAVENOUS

## 2023-01-24 MED ORDER — PROPOFOL 1000 MG/100ML IV EMUL
INTRAVENOUS | Status: AC
  Filled 2023-01-24: qty 100

## 2023-01-24 MED ORDER — PROPOFOL 10 MG/ML IV BOLUS
INTRAVENOUS | Status: DC | PRN
  Administered 2023-01-24: 80 mg via INTRAVENOUS

## 2023-01-24 MED ORDER — DIPHENOXYLATE-ATROPINE 2.5-0.025 MG PO TABS: 1.0000 | ORAL_TABLET | Freq: Four times a day (QID) | ORAL | 1 refills | Status: DC | PRN

## 2023-01-24 MED ORDER — PROPOFOL 500 MG/50ML IV EMUL
INTRAVENOUS | Status: DC | PRN
  Administered 2023-01-24: 150 ug/kg/min via INTRAVENOUS

## 2023-01-24 MED ORDER — GLYCOPYRROLATE PF 0.2 MG/ML IJ SOSY
PREFILLED_SYRINGE | INTRAMUSCULAR | Status: AC
  Filled 2023-01-24: qty 1

## 2023-01-24 MED ORDER — LACTATED RINGERS IV SOLN
INTRAVENOUS | Status: DC | PRN
Start: 2023-01-24 — End: 2023-01-24

## 2023-01-24 MED ORDER — ESOMEPRAZOLE MAGNESIUM 40 MG PO CPDR: 40.0000 mg | DELAYED_RELEASE_CAPSULE | Freq: Every day | ORAL | 2 refills | Status: DC

## 2023-01-24 MED ORDER — LIDOCAINE HCL (PF) 2 % IJ SOLN
INTRAMUSCULAR | Status: AC
  Filled 2023-01-24: qty 10

## 2023-01-24 MED ORDER — PHENYLEPHRINE 80 MCG/ML (10ML) SYRINGE FOR IV PUSH (FOR BLOOD PRESSURE SUPPORT)
PREFILLED_SYRINGE | INTRAVENOUS | Status: AC
  Filled 2023-01-24: qty 10

## 2023-01-24 NOTE — Anesthesia Postprocedure Evaluation (Signed)
Anesthesia Post Note  Patient: [name]  Procedure(s) Performed: ESOPHAGOGASTRODUODENOSCOPY (EGD) WITH PROPOFOL BRAVO PH STUDY BIOPSY  Patient location during evaluation: PACU Anesthesia Type: General Level of consciousness: awake and alert Pain management: pain level controlled Vital Signs Assessment: post-procedure vital signs reviewed and stable Respiratory status: spontaneous breathing, nonlabored ventilation, respiratory function stable and patient connected to nasal cannula oxygen Cardiovascular status: blood pressure returned to baseline and stable Postop Assessment: no apparent nausea or vomiting Anesthetic complications: no   There were no known notable events for this encounter.   Last Vitals:  Vitals:   01/24/23 0731 01/24/23 0849  BP: (!) 141/76 120/70  Pulse: 96 (!) 109  Resp: (!) 22 (!) 24  Temp: 36.6 C 36.4 C  SpO2: 98% 96%    Last Pain:  Vitals:   01/24/23 0849  TempSrc: Oral  PainSc: 0-No pain                 Comfort Iversen L Dacia Capers

## 2023-01-24 NOTE — Op Note (Addendum)
[facility] Patient Name: Jasmine Patton Procedure Date: 01/24/2023 8:17 AM MRN: [id number] Date of Birth: 08-24-1950 Attending MD: Sanjuan Dame , MD, [id number] CSN: [id number] Age: 72 Admit Type: Outpatient Procedure:                Upper GI endoscopy Indications:              Heartburn, Esophageal reflux symptoms that persist                            despite appropriate therapy Providers:                Sanjuan Dame, MD, Angelica Ran, Elinor Parkinson Referring MD:              Medicines:                Monitored Anesthesia Care Complications:            No immediate complications. Estimated Blood Loss:     Estimated blood loss was minimal. Procedure:                Pre-Anesthesia Assessment:                           - Prior to the procedure, a History and Physical                            was performed, and patient medications and                            allergies were reviewed. The patient's tolerance of                            previous anesthesia was also reviewed. The risks                            and benefits of the procedure and the sedation                            options and risks were discussed with the patient.                            All questions were answered, and informed consent                            was obtained. Prior Anticoagulants: The patient has                            taken no anticoagulant or antiplatelet agents                            except for aspirin. ASA Grade Assessment: II - A                            patient with mild systemic disease. After reviewing  the risks and benefits, the patient was deemed in                            satisfactory condition to undergo the procedure.                           After obtaining informed consent, the endoscope was                            passed under direct vision. Throughout the                            procedure, the patient's blood  pressure, pulse, and                            oxygen saturations were monitored continuously. The                            GIF-H190 ([id number]) scope was introduced through the                            mouth, and advanced to the second part of duodenum.                            The upper GI endoscopy was accomplished without                            difficulty. The patient tolerated the procedure                            well. Scope In: 8:31:30 AM Scope Out: 8:45:08 AM Total Procedure Duration: 0 hours 13 minutes 38 seconds  Findings:      LA Grade C (one or more mucosal breaks continuous between tops of 2 or       more mucosal folds, less than 75% circumference) esophagitis with no       bleeding was found in the lower third of the esophagus. Biopsies were       taken with a cold forceps for histology. The BRAVO capsule with delivery       system was introduced through the mouth and advanced into the esophagus,       such that the BRAVO pH capsule was positioned 30 cm from the incisors,       which was 6 cm proximal to the GE junction. Suction was applied to the       well of the BRAVO pH capsule to suck in the adjacent mucosa of the       esophagus using the external vacuum pump set at a minimum vacuum       pressure of 550 mmHg for 45 seconds. The BRAVO pH capsule was then       deployed by depressing the plunger on top of the handle to advance the       locking pin into the mucosa, thereby attaching the capsule to the       esophagus. The plunger was then rotated a quarter turn clockwise to  release the capsule from the delivery system. The delivery system was       then withdrawn. Endoscopy was utilized for probe placement and       diagnostic evaluation.      Esophagogastric landmarks were identified: the Z-line was found at 31 cm       and the gastroesophageal junction was found at 36 cm from the incisors.      Mildly erythematous mucosa without bleeding was found  in the gastric       antrum. Biopsies were taken with a cold forceps for histology.      A 5 cm hiatal hernia was present.      The gastroesophageal flap valve was visualized endoscopically and       classified as Hill Grade II (fold present, opens with respiration).      The duodenal bulb and second portion of the duodenum were normal. Impression:               - LA Grade C reflux esophagitis with no bleeding.                            Biopsied.                           - Esophagogastric landmarks identified.                           - Erythematous mucosa in the antrum. Biopsied.                           - 5 cm hiatal hernia.                           - Gastroesophageal flap valve classified as Hill                            Grade II (fold present, opens with respiration).                           - Normal duodenal bulb and second portion of the                            duodenum.                           - The BRAVO pH capsule was positioned 30 cm from                            the incisors, which was 6 cm proximal to the GE                            junction. Moderate Sedation:      Per Anesthesia Care Recommendation:           - Patient has a contact number available for                            emergencies. The signs and symptoms of potential  delayed complications were discussed with the                            patient. Return to normal activities tomorrow.                            Written discharge instructions were provided to the                            patient.                           - Resume previous diet.                           - Hold PPI for 4 days with BRAVO instructions,                            restart PPI after 4 days . Nexium BID for 2 months                           - Await pathology results.                           -Follow up with Dr Amil Amen for management of GERD                            with esophagitis  to repeat EGD in 8-12 weeks to                            assess underlying Barretts                           -Avoid NSAIDs as patient is taking Ibuprofen on                            consistent basis Procedure Code(s):        --- Professional ---                           [phone], Esophagogastroduodenoscopy, flexible,                            transoral; with biopsy, single or multiple Diagnosis Code(s):        --- Professional ---                           K21.00, Gastro-esophageal reflux disease with                            esophagitis, without bleeding                           K31.89, Other diseases of stomach and duodenum  K44.9, Diaphragmatic hernia without obstruction or                            gangrene                           R12, Heartburn CPT copyright 2022 American Medical Association. All rights reserved. The codes documented in this report are preliminary and upon coder review may  be revised to meet current compliance requirements. Sanjuan Dame, MD Sanjuan Dame, MD 01/24/2023 8:54:59 AM This report has been signed electronically. Number of Addenda: 0

## 2023-01-24 NOTE — Telephone Encounter (Signed)
I sent the lomotil prescription again.  Hopefully this will be approved by her insurance.  Okay to try the MiraLAX Gummies.  If this does not provide significant improvement, she will need to try the powder instead.

## 2023-01-24 NOTE — Discharge Instructions (Signed)

## 2023-01-24 NOTE — Telephone Encounter (Signed)
Patient states you wanted her to take miralax and she wants to know if she can take miralax mirafiber gummies instead.   Also states dr Adriana Simas prescribed lomotil and insurance denied. She takes when she travels. She has tried imodium and states it does not help. She wanted to know if you would prescribe lomotil and see if we could get approved through insurance or if you recommended something else.   [facility]  [phone]

## 2023-01-24 NOTE — Telephone Encounter (Signed)
Patient had egd with bravo today and had concerns about already filling up the sheet for day 1. She said she was going to make copies so she will have more space to record. She wanted to make sure she was doing it correctly. Having a lot of belching and says she pushes it when she belches and writes down the time and then it goes blank. She is not sure if that is right or not.   She also said her instructions say no hard candy but wanted to see if she could have cherry cough drops.   [phone]

## 2023-01-24 NOTE — H&P (Signed)
Primary Care Physician:  Tommie Sams, DO Primary Gastroenterologist:  Dr. Tasia Catchings  Pre-Procedure History & Physical: HPI:  Jasmine Patton is a 72 y.o. female with PMH MS, ADHD, anxiety, depression, GERD, hyperlipidemia, osteopenia, who presents for evaluation of changes in bowel movement frequency and GERD. Patient is interested in non-pharmacological treatment of GERD such as TIF  Past Medical History:  Diagnosis Date   ADD (attention deficit disorder)    Adult   Allergic rhinitis    Anxiety    Depression    GERD (gastroesophageal reflux disease)    Hyperlipidemia    Insomnia    Chronic   MS (multiple sclerosis) (HCC)    Osteopenia    Positive H. pylori test    Reflux     Past Surgical History:  Procedure Laterality Date   BIOPSY  01/09/2020   Procedure: BIOPSY;  Surgeon: Malissa Hippo, MD;  [facility];  Service: Endoscopy;;   COLONOSCOPY     COLONOSCOPY N/A 10/12/2016   Procedure: COLONOSCOPY;  Surgeon: Malissa Hippo, MD;  [facility];  Service: Endoscopy;  Laterality: N/A;   COLONOSCOPY N/A 01/09/2020   Procedure: COLONOSCOPY;  Surgeon: Malissa Hippo, MD;  [facility];  Service: Endoscopy;  Laterality: N/A;  100   Cyst removed from left breast     DILATION AND CURETTAGE OF UTERUS     ESOPHAGEAL DILATION N/A 01/09/2020   Procedure: ESOPHAGEAL DILATION;  Surgeon: Malissa Hippo, MD;  [facility];  Service: Endoscopy;  Laterality: N/A;   ESOPHAGOGASTRODUODENOSCOPY     ESOPHAGOGASTRODUODENOSCOPY N/A 10/12/2016   Procedure: ESOPHAGOGASTRODUODENOSCOPY (EGD);  Surgeon: Malissa Hippo, MD;  [facility];  Service: Endoscopy;  Laterality: N/A;  1:45   ESOPHAGOGASTRODUODENOSCOPY N/A 01/09/2020   Procedure: ESOPHAGOGASTRODUODENOSCOPY (EGD);  Surgeon: Malissa Hippo, MD;  [facility];  Service: Endoscopy;  Laterality: N/A;   POLYPECTOMY  10/12/2016   Procedure: POLYPECTOMY;  Surgeon: Malissa Hippo,  MD;  [facility];  Service: Endoscopy;;  cecal    POLYPECTOMY  01/09/2020   Procedure: POLYPECTOMY;  Surgeon: Malissa Hippo, MD;  [facility];  Service: Endoscopy;;    Prior to Admission medications   Medication Sig Start Date End Date Taking? Authorizing Provider  amphetamine-dextroamphetamine (ADDERALL) 10 MG tablet Take 1 tablet once or twice a day as needed 01/10/23  Yes Sater, Pearletha Furl, MD  Calcium Carbonate Antacid (MAALOX) 600 MG chewable tablet Chew 600 mg by mouth at bedtime.   Yes [provider]  Cholecalciferol 50 MCG (2000 UT) TABS Take 2,000 Units by mouth daily.    Yes [provider]  clonazePAM (KLONOPIN) 0.5 MG tablet TAKE ONE TABLET BY MOUTH ONCE A DAY AS NEEDED FOR INSOMNIA 11/29/22  Yes Cook, Jayce G, DO  desvenlafaxine (PRISTIQ) 25 MG 24 hr tablet Take 3 po every day x 1 week, then 2 po  every day x 1 week, then 1 po every day x 1 week, then discontinue 01/10/23  Yes Sater, Pearletha Furl, MD  doxepin (SINEQUAN) 50 MG capsule TAKE ONE CAPSULE (50 MG TOTAL) BY MOUTH AT BEDTIME. 06/13/22  Yes Cook, Jayce G, DO  Ibuprofen-diphenhydrAMINE HCl (IBUPROFEN PM) 200-25 MG CAPS Take 2 tablets by mouth daily as needed (Headache/back pain).   Yes [provider]  Melatonin 300 MCG TABS Take 300 mcg by mouth at bedtime.   Yes [provider]  Probiotic Product (PROBIOTIC ADVANCED PO)  Take by mouth daily at 6 (six) AM.   Yes [provider]  rosuvastatin (CRESTOR) 20 MG tablet TAKE ONE TABLET (20MG  TOTAL) BY MOUTH DAILY 06/13/22  Yes Cook, Jayce G, DO  tiZANidine (ZANAFLEX) 4 MG tablet TAKE ONE TABLET (4MG  TOTAL) BY MOUTH DAILY AS NEEDED FOR MUSCLE SPASMS 09/22/22  Yes Everlene Other G, DO    Allergies as of 12/22/2022 - Review Complete 11/28/2022  Allergen Reaction Noted   Ambien [zolpidem tartrate] Other (See Comments)    Etodolac Rash 09/17/2012    Family History  Problem Relation Age of Onset   Cancer Mother         Colon   Colon cancer Mother    Hypertension Father     Social History   Socioeconomic History   Marital status: Married    Spouse name: Not on file   Number of children: Not on file   Years of education: Not on file   Highest education level: Bachelor's degree (e.g., BA, AB, BS)  Occupational History   Not on file  Tobacco Use   Smoking status: Never   Smokeless tobacco: Never  Vaping Use   Vaping status: Never Used  Substance and Sexual Activity   Alcohol use: Yes    Comment: Socially   Drug use: No   Sexual activity: Not on file  Other Topics Concern   Not on file  Social History Narrative   Not on file   Social Determinants of Health   Financial Resource Strain: Low Risk  (11/02/2022)   Overall Financial Resource Strain (CARDIA)    Difficulty of Paying Living Expenses: Not very hard  Food Insecurity: No Food Insecurity (11/02/2022)   Hunger Vital Sign    Worried About Running Out of Food in the Last Year: Never true    Ran Out of Food in the Last Year: Never true  Transportation Needs: No Transportation Needs (11/02/2022)   PRAPARE - Administrator, Civil Service (Medical): No    Lack of Transportation (Non-Medical): No  Physical Activity: Unknown (11/02/2022)   Exercise Vital Sign    Days of Exercise per Week: 0 days    Minutes of Exercise per Session: Not on file  Stress: Stress Concern Present (11/02/2022)   [employer] of Occupational Health - Occupational Stress Questionnaire    Feeling of Stress : To some extent  Social Connections: Moderately Integrated (11/02/2022)   Social Connection and Isolation Panel [NHANES]    Frequency of Communication with Friends and Family: More than three times a week    Frequency of Social Gatherings with Friends and Family: Twice a week    Attends Religious Services: 1 to 4 times per year    Active Member of [employer] or Organizations: No    Attends Engineer, structural: Not on file    Marital Status:  Married  Catering manager Violence: Not on file    Review of Systems: See HPI, otherwise negative ROS  Physical Exam: Vital signs in last 24 hours:     General:   Alert,  Well-developed, well-nourished, pleasant and cooperative in NAD Head:  Normocephalic and atraumatic. Eyes:  Sclera clear, no icterus.   Conjunctiva pink. Ears:  Normal auditory acuity. Nose:  No deformity, discharge,  or lesions. Msk:  Symmetrical without gross deformities. Normal posture. Extremities:  Without clubbing or edema. Neurologic:  Alert and  oriented x4;  grossly normal neurologically. Skin:  Intact without significant lesions or rashes. Psych:  Alert and  cooperative. Normal mood and affect.  Impression/Plan: Jasmine Patton is a 72 y.o. female with PMH MS, ADHD, anxiety, depression, GERD, hyperlipidemia, osteopenia, who presents for evaluation of changes in bowel movement frequency and GERD. Patient is interested in non-pharmacological treatment of GERD such as TIF  Proceed with EGD with BRAVO placement   The risks of the procedure including infection, bleed, or perforation as well as benefits, limitations, alternatives and imponderables have been reviewed with the patient. Questions have been answered. All parties agreeable.

## 2023-01-24 NOTE — Transfer of Care (Addendum)
Immediate Anesthesia Transfer of Care Note  Patient: Jasmine Patton  Procedure(s) Performed: ESOPHAGOGASTRODUODENOSCOPY (EGD) WITH PROPOFOL BRAVO PH STUDY BIOPSY  Patient Location: Endoscopy Unit  Anesthesia Type:General  Level of Consciousness: drowsy and patient cooperative  Airway & Oxygen Therapy: Patient Spontanous Breathing and Patient connected to nasal cannula oxygen  Post-op Assessment: Report given to RN and Post -op Vital signs reviewed and stable  Post vital signs: Reviewed and stable  Last Vitals:  Vitals Value Taken Time  BP 120/70 01/24/23   0849  Temp 36.4 01/24/23   0849  Pulse 111 01/24/23   0849  Resp 24 01/24/23   0849  SpO2 96% 01/24/23   0849    Last Pain:  Vitals:   01/24/23 0827  TempSrc:   PainSc: 0-No pain      Patients Stated Pain Goal: 7 (01/24/23 0731)  Complications: No notable events documented.

## 2023-01-25 ENCOUNTER — Encounter (INDEPENDENT_AMBULATORY_CARE_PROVIDER_SITE_OTHER): Payer: Self-pay | Admitting: Gastroenterology

## 2023-01-25 NOTE — Telephone Encounter (Signed)
Hi I spoke to the patient , she is doing well and got hang of the BRAVO .

## 2023-01-26 DIAGNOSIS — H26491 Other secondary cataract, right eye: Secondary | ICD-10-CM | POA: Diagnosis not present

## 2023-01-26 LAB — SURGICAL PATHOLOGY

## 2023-01-26 NOTE — Telephone Encounter (Signed)
Discussed with patient and pt states she has not heard from pharmacy. Called [facility] and they did not receive rx for lomotil. Could you resend it? ( I think it is controlled med and it printed but not able to fax to pharmacy)

## 2023-01-26 NOTE — Telephone Encounter (Signed)
Left message to return call 

## 2023-01-27 MED ORDER — DIPHENOXYLATE-ATROPINE 2.5-0.025 MG PO TABS: 1.0000 | ORAL_TABLET | Freq: Four times a day (QID) | ORAL | 1 refills | Status: AC | PRN

## 2023-01-27 NOTE — Telephone Encounter (Signed)
Medication sent to pharmacy  

## 2023-01-27 NOTE — Addendum Note (Signed)
Addended by: Dolores Frame on: 01/27/2023 11:56 AM   Modules accepted: Orders

## 2023-01-31 ENCOUNTER — Encounter (HOSPITAL_COMMUNITY): Payer: Self-pay | Admitting: Gastroenterology

## 2023-01-31 ENCOUNTER — Telehealth (INDEPENDENT_AMBULATORY_CARE_PROVIDER_SITE_OTHER): Payer: Self-pay | Admitting: *Deleted

## 2023-01-31 ENCOUNTER — Encounter (INDEPENDENT_AMBULATORY_CARE_PROVIDER_SITE_OTHER): Payer: Self-pay

## 2023-01-31 NOTE — Telephone Encounter (Signed)
Fax from [employer] requesing pa through cover my meds. Put in info on cover my meds and I got response that med is available without authorization. Called [place] pharmacy and was told med did go through and they will get ready for patient.

## 2023-02-06 ENCOUNTER — Telehealth (INDEPENDENT_AMBULATORY_CARE_PROVIDER_SITE_OTHER): Payer: Self-pay | Admitting: Gastroenterology

## 2023-02-06 DIAGNOSIS — K3189 Other diseases of stomach and duodenum: Secondary | ICD-10-CM | POA: Diagnosis not present

## 2023-02-06 DIAGNOSIS — K2100 Gastro-esophageal reflux disease with esophagitis, without bleeding: Secondary | ICD-10-CM | POA: Diagnosis not present

## 2023-02-06 DIAGNOSIS — K259 Gastric ulcer, unspecified as acute or chronic, without hemorrhage or perforation: Secondary | ICD-10-CM

## 2023-02-06 DIAGNOSIS — K449 Diaphragmatic hernia without obstruction or gangrene: Secondary | ICD-10-CM | POA: Diagnosis not present

## 2023-02-06 NOTE — Procedures (Signed)
BRAVO and Biopsy results  Biopsies negative for EOE , no fungal infection , positive for focal erosions/ulcerations Gastric biopsies negative for H.Pylori and Intestinal Metaplasia      Recommendations:  Spoke to the patient husband as patient was sleeping ,  -Nexium BID for 2 months  - Follow up with Dr Amil Amen for management of GERD with esophagitis to repeat EGD in 8- 12 weeks to assess underlying Barretts  - Avoid NSAIDs as patient was taking Ibuprofen on consistent basis -Continue to follow up in GI clinic

## 2023-02-06 NOTE — Telephone Encounter (Signed)
Dolores Frame, MD  Franky Macho, MD; Marlowe Shores, LPN Thanks Tessie Eke!  Hi Daryn Hicks,  Can you please schedule a repeat EGD in 10 weeks? Dx: Follow-up of esophagitis. Room: Any  Thanks,  Katrinka Blazing, MD Gastroenterology and Hepatology [facility]

## 2023-02-07 ENCOUNTER — Other Ambulatory Visit (INDEPENDENT_AMBULATORY_CARE_PROVIDER_SITE_OTHER): Payer: Self-pay | Admitting: Gastroenterology

## 2023-02-07 NOTE — Telephone Encounter (Signed)
Am I scheduling this pt with you in 10 weeks or Dr.Ahmed? Please advise. Thank you (just want to make sure she gets on the right schedule)

## 2023-02-07 NOTE — Telephone Encounter (Signed)
Please schedule with me. Thanks!

## 2023-02-07 NOTE — Telephone Encounter (Signed)
Already responded by other means.

## 2023-02-08 NOTE — Telephone Encounter (Signed)
10 weeks from EGD would be 04/04/23. Pt has appt with Chelsea on 04/04/23. Do not have schedule for January at this time. Pt will have EGD scheduled once she sees Chelsea on 04/04/23 or pt will be contacted once January schedule is out.

## 2023-02-08 NOTE — Telephone Encounter (Signed)
Ok. Sounds good, January is fine as well Thanks

## 2023-02-08 NOTE — Telephone Encounter (Signed)
Can she have it a few days prior to the appointment? Does not need to be exactly 10 weeks. If she wants to do it afterwards, that's ok as well

## 2023-02-08 NOTE — Telephone Encounter (Signed)
You are out of the office toward end of December and pt is going to [place] for a while and returning for the appt with [facility]

## 2023-02-14 DIAGNOSIS — H52202 Unspecified astigmatism, left eye: Secondary | ICD-10-CM | POA: Diagnosis not present

## 2023-02-14 DIAGNOSIS — Z961 Presence of intraocular lens: Secondary | ICD-10-CM | POA: Diagnosis not present

## 2023-02-14 DIAGNOSIS — H25812 Combined forms of age-related cataract, left eye: Secondary | ICD-10-CM | POA: Diagnosis not present

## 2023-02-14 DIAGNOSIS — H25012 Cortical age-related cataract, left eye: Secondary | ICD-10-CM | POA: Diagnosis not present

## 2023-02-14 DIAGNOSIS — H2512 Age-related nuclear cataract, left eye: Secondary | ICD-10-CM | POA: Diagnosis not present

## 2023-02-20 ENCOUNTER — Other Ambulatory Visit (INDEPENDENT_AMBULATORY_CARE_PROVIDER_SITE_OTHER): Payer: Self-pay | Admitting: Gastroenterology

## 2023-02-20 NOTE — Telephone Encounter (Signed)
Patient to take bid for another month. Per Dr. Tasia Catchings 02/06/2023 post op note discussed with husband to take bid for two months.

## 2023-02-28 ENCOUNTER — Ambulatory Visit (INDEPENDENT_AMBULATORY_CARE_PROVIDER_SITE_OTHER): Payer: Medicare PPO | Admitting: Gastroenterology

## 2023-03-06 ENCOUNTER — Ambulatory Visit (INDEPENDENT_AMBULATORY_CARE_PROVIDER_SITE_OTHER): Payer: Medicare PPO | Admitting: Gastroenterology

## 2023-03-07 ENCOUNTER — Encounter (INDEPENDENT_AMBULATORY_CARE_PROVIDER_SITE_OTHER): Payer: Self-pay

## 2023-03-07 NOTE — Telephone Encounter (Signed)
Called patient to get more information since she said she was having some severe symptoms. She states belching and heartburn was better. But about 3 weeks ago started having a cough when lying down. Only happens when lying down and no congestion. She said she had appt in December but got moved to January with chelsea. She does not remember what was said after the bravo about if she was a candidate for the TIF procedure. She is wanting to know if ok to wait til January since she was told she needed to follow up in December and since she having new symptom of coughing.

## 2023-03-08 ENCOUNTER — Encounter (INDEPENDENT_AMBULATORY_CARE_PROVIDER_SITE_OTHER): Payer: Self-pay | Admitting: Gastroenterology

## 2023-03-14 ENCOUNTER — Ambulatory Visit: Payer: Medicare PPO | Admitting: Family Medicine

## 2023-03-14 VITALS — BP 156/82 | HR 105 | Temp 98.1°F | Ht 61.0 in | Wt 167.0 lb

## 2023-03-14 DIAGNOSIS — R052 Subacute cough: Secondary | ICD-10-CM | POA: Insufficient documentation

## 2023-03-14 MED ORDER — PROMETHAZINE-DM 6.25-15 MG/5ML PO SYRP: 5.0000 mL | ORAL_SOLUTION | Freq: Four times a day (QID) | ORAL | 0 refills | Status: DC | PRN

## 2023-03-14 MED ORDER — TRIAMCINOLONE ACETONIDE 55 MCG/ACT NA AERO: 2.0000 | INHALATION_SPRAY | Freq: Every day | NASAL | 12 refills | Status: DC

## 2023-03-14 NOTE — Progress Notes (Signed)
Subjective:  Patient ID: Jasmine Patton, female    DOB: [birth year]  Age: 72 y.o. MRN: [id number]  CC: Cough   HPI:  72 year old female presents for evaluation of cough.  1 month history of cough.  Worse at night when lying down.  She states that she feels like there is something in the back of her throat.  She has underlying GERD and esophagitis.  She is taking Nexium.  No fever.  No other associated symptoms.  No relief with over-the-counter treatment.  Patient Active Problem List   Diagnosis Date Noted   Subacute cough 03/14/2023   Gastritis and gastroduodenitis 01/24/2023   Gastroesophageal reflux disease with esophagitis without hemorrhage 01/24/2023   Hiatal hernia 11/28/2022   Irritable bowel syndrome with mixed bowel habits 06/13/2022   Prediabetes 09/25/2021   Muscle spasms of both lower extremities 06/11/2020   Hyperlipidemia 12/30/2016   Insomnia 02/09/2014   Anxiety and depression 05/05/2009   Multiple sclerosis (HCC) 05/05/2009    Social Hx   Social History   Socioeconomic History   Marital status: Married    Spouse name: Not on file   Number of children: Not on file   Years of education: Not on file   Highest education level: Bachelor's degree (e.g., BA, AB, BS)  Occupational History   Not on file  Tobacco Use   Smoking status: Never   Smokeless tobacco: Never  Vaping Use   Vaping status: Never Used  Substance and Sexual Activity   Alcohol use: Yes    Comment: Socially   Drug use: No   Sexual activity: Not on file  Other Topics Concern   Not on file  Social History Narrative   Not on file   Social Determinants of Health   Financial Resource Strain: Low Risk  (11/02/2022)   Overall Financial Resource Strain (CARDIA)    Difficulty of Paying Living Expenses: Not very hard  Food Insecurity: No Food Insecurity (11/02/2022)   Hunger Vital Sign    Worried About Running Out of Food in the Last Year: Never true    Ran Out of Food in the Last Year:  Never true  Transportation Needs: No Transportation Needs (11/02/2022)   PRAPARE - Administrator, Civil Service (Medical): No    Lack of Transportation (Non-Medical): No  Physical Activity: Unknown (11/02/2022)   Exercise Vital Sign    Days of Exercise per Week: 0 days    Minutes of Exercise per Session: Not on file  Stress: Stress Concern Present (11/02/2022)   [employer] of Occupational Health - Occupational Stress Questionnaire    Feeling of Stress : To some extent  Social Connections: Moderately Integrated (11/02/2022)   Social Connection and Isolation Panel [NHANES]    Frequency of Communication with Friends and Family: More than three times a week    Frequency of Social Gatherings with Friends and Family: Twice a week    Attends Religious Services: 1 to 4 times per year    Active Member of [employer] or Organizations: No    Attends Engineer, structural: Not on file    Marital Status: Married    Review of Systems Per HPI  Objective:  BP (!) 156/82   Pulse (!) 105   Temp 98.1 F (36.7 C)   Ht 5\' 1"  (1.549 m)   Wt 167 lb (75.8 kg)   SpO2 98%   BMI 31.55 kg/m      03/14/2023   10:13 AM 01/24/2023  8:49 AM 01/24/2023    7:31 AM  BP/Weight  Systolic BP 156 120 141  Diastolic BP 82 70 76  Wt. (Lbs) 167    BMI 31.55 kg/m2      Physical Exam Vitals and nursing note reviewed.  Constitutional:      General: She is not in acute distress.    Appearance: Normal appearance.  HENT:     Head: Normocephalic and atraumatic.     Mouth/Throat:     Pharynx: Oropharynx is clear.  Cardiovascular:     Rate and Rhythm: Normal rate and regular rhythm.  Pulmonary:     Effort: Pulmonary effort is normal.     Breath sounds: Normal breath sounds. No wheezing, rhonchi or rales.  Neurological:     Mental Status: She is alert.     Lab Results  Component Value Date   WBC 7.3 11/15/2022   HGB 12.6 11/15/2022   HCT 38.4 11/15/2022   PLT 341 11/15/2022    GLUCOSE 125 (H) 11/15/2022   CHOL 170 11/15/2022   TRIG 178 (H) 11/15/2022   HDL 62 11/15/2022   LDLCALC 78 11/15/2022   ALT 12 11/15/2022   AST 19 11/15/2022   NA 140 11/15/2022   K 4.6 11/15/2022   CL 102 11/15/2022   CREATININE 0.99 11/15/2022   BUN 10 11/15/2022   CO2 25 11/15/2022   TSH 1.920 11/15/2022   HGBA1C 5.7 (H) 11/15/2022     Assessment & Plan:   Problem List Items Addressed This Visit       Other   Subacute cough - Primary    Likely upper airway cough syndrome/postnasal drip.  GERD could be contributing. Nasacort as directed.  Promethazine DM for cough.  Chest x-ray for further evaluation.      Relevant Orders   DG Chest 2 View    Meds ordered this encounter  Medications   triamcinolone (NASACORT) 55 MCG/ACT AERO nasal inhaler    Sig: Place 2 sprays into the nose daily.    Dispense:  1 each    Refill:  12   promethazine-dextromethorphan (PROMETHAZINE-DM) 6.25-15 MG/5ML syrup    Sig: Take 5 mLs by mouth 4 (four) times daily as needed for cough.    Dispense:  118 mL    Refill:  0    Follow-up:  Return if symptoms worsen or fail to improve.  Everlene Other DO [facility]

## 2023-03-14 NOTE — Assessment & Plan Note (Signed)
Likely upper airway cough syndrome/postnasal drip.  GERD could be contributing. Nasacort as directed.  Promethazine DM for cough.  Chest x-ray for further evaluation.

## 2023-03-14 NOTE — Patient Instructions (Signed)
Chest x-ray ordered.  Go to the main entrance to the hospital and they will take care of the x-ray for you  Medications as prescribed.

## 2023-03-15 ENCOUNTER — Other Ambulatory Visit (HOSPITAL_COMMUNITY): Payer: Self-pay | Admitting: Family Medicine

## 2023-03-15 ENCOUNTER — Ambulatory Visit (HOSPITAL_COMMUNITY)
Admission: RE | Admit: 2023-03-15 | Discharge: 2023-03-15 | Disposition: A | Discharge status: Home / Self Care | Payer: Medicare PPO | Source: Ambulatory Visit | Attending: Family Medicine | Admitting: Family Medicine

## 2023-03-15 ENCOUNTER — Encounter (HOSPITAL_COMMUNITY): Payer: Self-pay

## 2023-03-15 DIAGNOSIS — Z1231 Encounter for screening mammogram for malignant neoplasm of breast: Secondary | ICD-10-CM

## 2023-03-15 DIAGNOSIS — R052 Subacute cough: Secondary | ICD-10-CM | POA: Diagnosis not present

## 2023-03-15 DIAGNOSIS — R059 Cough, unspecified: Secondary | ICD-10-CM | POA: Diagnosis not present

## 2023-03-16 NOTE — Telephone Encounter (Signed)
Left message to return call 

## 2023-03-17 ENCOUNTER — Other Ambulatory Visit: Payer: Self-pay | Admitting: Family Medicine

## 2023-03-17 MED ORDER — PROMETHAZINE-DM 6.25-15 MG/5ML PO SYRP: 5.0000 mL | ORAL_SOLUTION | Freq: Four times a day (QID) | ORAL | 0 refills | Status: DC | PRN

## 2023-03-21 ENCOUNTER — Encounter (INDEPENDENT_AMBULATORY_CARE_PROVIDER_SITE_OTHER): Payer: Self-pay

## 2023-03-21 NOTE — Telephone Encounter (Signed)
Pt returned call. Pt scheduled for 04/18/23 at 9:45am. Instructions will be sent on my chart. Will attempt PA in the morning (working on a STAT for provider)

## 2023-03-23 ENCOUNTER — Other Ambulatory Visit: Payer: Self-pay | Admitting: Family Medicine

## 2023-03-23 DIAGNOSIS — F5101 Primary insomnia: Secondary | ICD-10-CM

## 2023-03-23 DIAGNOSIS — F419 Anxiety disorder, unspecified: Secondary | ICD-10-CM

## 2023-04-04 ENCOUNTER — Ambulatory Visit (INDEPENDENT_AMBULATORY_CARE_PROVIDER_SITE_OTHER): Payer: Medicare PPO | Admitting: Gastroenterology

## 2023-04-06 ENCOUNTER — Other Ambulatory Visit (INDEPENDENT_AMBULATORY_CARE_PROVIDER_SITE_OTHER): Payer: Self-pay | Admitting: Gastroenterology

## 2023-04-10 ENCOUNTER — Ambulatory Visit: Payer: Medicare PPO | Admitting: Nurse Practitioner

## 2023-04-10 ENCOUNTER — Ambulatory Visit (HOSPITAL_COMMUNITY)
Admission: RE | Admit: 2023-04-10 | Discharge: 2023-04-10 | Disposition: A | Discharge status: Home / Self Care | Payer: Medicare PPO | Source: Ambulatory Visit | Attending: Nurse Practitioner | Admitting: Nurse Practitioner

## 2023-04-10 VITALS — BP 138/84 | HR 109 | Temp 97.7°F | Ht 61.0 in | Wt 172.0 lb

## 2023-04-10 DIAGNOSIS — S8001XA Contusion of right knee, initial encounter: Secondary | ICD-10-CM | POA: Diagnosis not present

## 2023-04-10 DIAGNOSIS — M25561 Pain in right knee: Secondary | ICD-10-CM | POA: Diagnosis not present

## 2023-04-11 ENCOUNTER — Encounter: Payer: Self-pay | Admitting: Nurse Practitioner

## 2023-04-11 NOTE — Progress Notes (Signed)
   Subjective:    Patient ID: Jasmine Patton, female    DOB: 13-Dec-1950, 73 y.o.   MRN: [id number]  HPI Presents with complaints of right knee pain after a fall on December 12.  Patient tripped over a basket in her house.  Had significant bruising of the right arm and knee which has basically resolved except for 1 small spot on the patella.  Has applied ice with elevation.  Still having some pain in the area with certain movements but overall minimal issues at this point.        Objective:   Physical Exam NAD.  Alert, oriented.  No significant edema noted in the right knee compared to left.  A localized small pink area noted near the lower mid patella.  No joint laxity noted.  No significant crepitus noted.  No ballottement or tenderness with palpation of the patella.  The only tenderness noted is directly at the area of discoloration.  Full ROM of the knee with mild tenderness mainly with full extension or flexion.  Gait minimal limp. Today's Vitals   04/10/23 1539  BP: 138/84  Pulse: (!) 109  Temp: 97.7 F (36.5 C)  SpO2: 96%  Weight: 172 lb (78 kg)  Height: 5' 1 (1.549 m)   Body mass index is 32.5 kg/m.        Assessment & Plan:  Contusion of right knee, initial encounter - Plan: DG Knee 1-2 Views Right X-ray of the right knee ordered to rule out fracture. OTC topicals as directed.  Expect continued gradual resolution of knee pain.  Recheck here as needed.

## 2023-04-14 ENCOUNTER — Encounter (HOSPITAL_COMMUNITY)
Admission: RE | Admit: 2023-04-14 | Discharge: 2023-04-14 | Disposition: A | Discharge status: Home / Self Care | Payer: Medicare PPO | Source: Ambulatory Visit | Attending: Gastroenterology | Admitting: Gastroenterology

## 2023-04-14 ENCOUNTER — Other Ambulatory Visit: Payer: Self-pay

## 2023-04-14 ENCOUNTER — Encounter (HOSPITAL_COMMUNITY): Payer: Self-pay

## 2023-04-17 ENCOUNTER — Encounter (INDEPENDENT_AMBULATORY_CARE_PROVIDER_SITE_OTHER): Payer: Self-pay

## 2023-04-17 ENCOUNTER — Encounter: Payer: Self-pay | Admitting: Nurse Practitioner

## 2023-04-18 ENCOUNTER — Ambulatory Visit (HOSPITAL_COMMUNITY)
Admission: RE | Admit: 2023-04-18 | Discharge: 2023-04-18 | Disposition: A | Discharge status: Home / Self Care | Payer: Medicare PPO | Attending: Gastroenterology | Admitting: Gastroenterology

## 2023-04-18 ENCOUNTER — Encounter (HOSPITAL_COMMUNITY)
Admission: RE | Disposition: A | Discharge status: Home / Self Care | Payer: Self-pay | Source: Home / Self Care | Attending: Gastroenterology

## 2023-04-18 ENCOUNTER — Ambulatory Visit (HOSPITAL_BASED_OUTPATIENT_CLINIC_OR_DEPARTMENT_OTHER): Payer: Medicare PPO | Admitting: Anesthesiology

## 2023-04-18 ENCOUNTER — Encounter (HOSPITAL_COMMUNITY): Payer: Self-pay | Admitting: Gastroenterology

## 2023-04-18 ENCOUNTER — Ambulatory Visit (HOSPITAL_COMMUNITY): Payer: Medicare PPO | Admitting: Anesthesiology

## 2023-04-18 DIAGNOSIS — F418 Other specified anxiety disorders: Secondary | ICD-10-CM | POA: Diagnosis not present

## 2023-04-18 DIAGNOSIS — K449 Diaphragmatic hernia without obstruction or gangrene: Secondary | ICD-10-CM | POA: Diagnosis not present

## 2023-04-18 DIAGNOSIS — G35 Multiple sclerosis: Secondary | ICD-10-CM | POA: Insufficient documentation

## 2023-04-18 DIAGNOSIS — K219 Gastro-esophageal reflux disease without esophagitis: Secondary | ICD-10-CM | POA: Diagnosis not present

## 2023-04-18 DIAGNOSIS — Z09 Encounter for follow-up examination after completed treatment for conditions other than malignant neoplasm: Secondary | ICD-10-CM | POA: Insufficient documentation

## 2023-04-18 DIAGNOSIS — K209 Esophagitis, unspecified without bleeding: Secondary | ICD-10-CM

## 2023-04-18 DIAGNOSIS — F419 Anxiety disorder, unspecified: Secondary | ICD-10-CM | POA: Diagnosis not present

## 2023-04-18 DIAGNOSIS — K2289 Other specified disease of esophagus: Secondary | ICD-10-CM | POA: Diagnosis not present

## 2023-04-18 DIAGNOSIS — K2100 Gastro-esophageal reflux disease with esophagitis, without bleeding: Secondary | ICD-10-CM | POA: Diagnosis not present

## 2023-04-18 HISTORY — PX: ESOPHAGOGASTRODUODENOSCOPY (EGD) WITH PROPOFOL: SHX5813

## 2023-04-18 HISTORY — PX: BIOPSY: SHX5522

## 2023-04-18 SURGERY — ESOPHAGOGASTRODUODENOSCOPY (EGD) WITH PROPOFOL
Anesthesia: General

## 2023-04-18 MED ORDER — LACTATED RINGERS IV SOLN: INTRAVENOUS | Status: DC | PRN

## 2023-04-18 MED ORDER — SODIUM CHLORIDE 0.9% FLUSH: 3.0000 mL | Freq: Two times a day (BID) | INTRAVENOUS | Status: DC

## 2023-04-18 MED ORDER — SODIUM CHLORIDE 0.9% FLUSH: 3.0000 mL | INTRAVENOUS | Status: DC | PRN

## 2023-04-18 MED ORDER — PROPOFOL 500 MG/50ML IV EMUL
INTRAVENOUS | Status: DC | PRN
  Administered 2023-04-18: 150 ug/kg/min via INTRAVENOUS

## 2023-04-18 MED ORDER — STERILE WATER FOR IRRIGATION IR SOLN
Status: DC | PRN
  Administered 2023-04-18: 60 mL

## 2023-04-18 MED ORDER — PROPOFOL 10 MG/ML IV BOLUS
INTRAVENOUS | Status: DC | PRN
Start: 2023-04-18 — End: 2023-04-18
  Administered 2023-04-18: 100 mg via INTRAVENOUS

## 2023-04-18 MED ORDER — LIDOCAINE HCL (PF) 2 % IJ SOLN
INTRAMUSCULAR | Status: DC | PRN
  Administered 2023-04-18: 100 mg via INTRADERMAL

## 2023-04-18 NOTE — H&P (Signed)
 Jasmine Patton is an 73 y.o. female.   Chief Complaint: Follow-up esophagitis HPI: 73 year old female with past medical history of MS, ADHD, anxiety, depression, GERD, hyperlipidemia, osteopenia, coming for follow-up of esophagitis.  Patient reports that after she starts taking her PPI twice a day her heartburn has subsided.  No dysphagia or odynophagia.   The patient denies having any nausea, vomiting, fever, chills, hematochezia, melena, hematemesis, abdominal distention, abdominal pain, diarrhea, jaundice, pruritus or weight loss.   Past Medical History:  Diagnosis Date   ADD (attention deficit disorder)    Adult   Allergic rhinitis    Anxiety    Depression    GERD (gastroesophageal reflux disease)    Hyperlipidemia    Insomnia    Chronic   MS (multiple sclerosis) (HCC)    Osteopenia    Positive H. pylori test    Reflux     Past Surgical History:  Procedure Laterality Date   BIOPSY  01/09/2020   Procedure: BIOPSY;  Surgeon: Golda Claudis PENNER, MD;  [facility];  Service: Endoscopy;;   BIOPSY  01/24/2023   Procedure: BIOPSY;  Surgeon: Cinderella Deatrice FALCON, MD;  [facility];  Service: Endoscopy;;   BRAVO PH STUDY N/A 01/24/2023   Procedure: DOLLENE PH STUDY;  Surgeon: Cinderella Deatrice FALCON, MD;  [facility]: [facility];  Service: Endoscopy;  Laterality: N/A;  1:45PM;ASA 1-2   BREAST CYST ASPIRATION Left 2004   benign   COLONOSCOPY     COLONOSCOPY N/A 10/12/2016   Procedure: COLONOSCOPY;  Surgeon: Golda Claudis PENNER, MD;  [facility];  Service: Endoscopy;  Laterality: N/A;   COLONOSCOPY N/A 01/09/2020   Procedure: COLONOSCOPY;  Surgeon: Golda Claudis PENNER, MD;  [facility];  Service: Endoscopy;  Laterality: N/A;  100   Cyst removed from left breast     DILATION AND CURETTAGE OF UTERUS     ESOPHAGEAL DILATION N/A 01/09/2020   Procedure: ESOPHAGEAL DILATION;  Surgeon: Golda Claudis PENNER, MD;  [facility];  Service: Endoscopy;   Laterality: N/A;   ESOPHAGOGASTRODUODENOSCOPY     ESOPHAGOGASTRODUODENOSCOPY N/A 10/12/2016   Procedure: ESOPHAGOGASTRODUODENOSCOPY (EGD);  Surgeon: Golda Claudis PENNER, MD;  [facility];  Service: Endoscopy;  Laterality: N/A;  1:45   ESOPHAGOGASTRODUODENOSCOPY N/A 01/09/2020   Procedure: ESOPHAGOGASTRODUODENOSCOPY (EGD);  Surgeon: Golda Claudis PENNER, MD;  [facility];  Service: Endoscopy;  Laterality: N/A;   ESOPHAGOGASTRODUODENOSCOPY (EGD) WITH PROPOFOL  N/A 01/24/2023   Procedure: ESOPHAGOGASTRODUODENOSCOPY (EGD) WITH PROPOFOL ;  Surgeon: Cinderella Deatrice FALCON, MD;  [facility];  Service: Endoscopy;  Laterality: N/A;  1:45PM;ASA 1-2   POLYPECTOMY  10/12/2016   Procedure: POLYPECTOMY;  Surgeon: Golda Claudis PENNER, MD;  [facility];  Service: Endoscopy;;  cecal    POLYPECTOMY  01/09/2020   Procedure: POLYPECTOMY;  Surgeon: Golda Claudis PENNER, MD;  [facility];  Service: Endoscopy;;    Family History  Problem Relation Age of Onset   Cancer Mother        Colon   Colon cancer Mother    Hypertension Father    Social History:  reports that she has never smoked. She has never used smokeless tobacco. She reports current alcohol use. She reports that she does not use drugs.  Allergies:  Allergies  Allergen Reactions   Ambien [Zolpidem Tartrate] Other (See Comments)    forgetfulness    Etodolac Rash    Medications Prior to Admission  Medication Sig Dispense  Refill   amphetamine -dextroamphetamine  (ADDERALL) 10 MG tablet Take 1 tablet once or twice a day as needed 60 tablet 0   Calcium  Carbonate Antacid (MAALOX) 600 MG chewable tablet Chew 600 mg by mouth at bedtime.     clonazePAM  (KLONOPIN ) 0.5 MG tablet TAKE ONE TABLET BY MOUTH ONCE A DAY AS NEEDED FOR INSOMNIA 30 tablet 3   desvenlafaxine  (PRISTIQ ) 25 MG 24 hr tablet Take 3 po every day x 1 week, then 2 po  every day x 1 week, then 1 po every day x 1 week, then discontinue 42 tablet 0    diphenoxylate -atropine  (LOMOTIL ) 2.5-0.025 MG tablet Take 1 tablet by mouth 4 (four) times daily as needed for diarrhea or loose stools. 180 tablet 1   doxepin  (SINEQUAN ) 50 MG capsule TAKE ONE CAPSULE (50 MG TOTAL) BY MOUTH AT BEDTIME. 90 capsule 3   esomeprazole  (NEXIUM ) 40 MG capsule TAKE ONE CAPSULE (40MG  TOTAL) BY MOUTH TWO TIMES A DAY BEFORE A MEAL 60 capsule 0   Melatonin 300 MCG TABS Take 300 mcg by mouth at bedtime.     Probiotic Product (PROBIOTIC ADVANCED PO) Take by mouth daily at 6 (six) AM.     rosuvastatin  (CRESTOR ) 20 MG tablet TAKE ONE TABLET (20MG  TOTAL) BY MOUTH DAILY 90 tablet 3   tiZANidine  (ZANAFLEX ) 4 MG tablet TAKE ONE TABLET (4MG  TOTAL) BY MOUTH DAILY AS NEEDED FOR MUSCLE SPASMS 30 tablet 1   triamcinolone  (NASACORT ) 55 MCG/ACT AERO nasal inhaler Place 2 sprays into the nose daily. 1 each 12   Cholecalciferol 50 MCG (2000 UT) TABS Take 2,000 Units by mouth daily.       No results found for this or any previous visit (from the past 48 hours). No results found.  Review of Systems  All other systems reviewed and are negative.   Blood pressure (!) 145/71, pulse 81, temperature 98.4 F (36.9 C), temperature source Oral, resp. rate 18, SpO2 99%. Physical Exam  GENERAL: The patient is AO x3, in no acute distress. HEENT: Head is normocephalic and atraumatic. EOMI are intact. Mouth is well hydrated and without lesions. NECK: Supple. No masses LUNGS: Clear to auscultation. No presence of rhonchi/wheezing/rales. Adequate chest expansion HEART: RRR, normal s1 and s2. ABDOMEN: Soft, nontender, no guarding, no peritoneal signs, and nondistended. BS +. No masses. EXTREMITIES: Without any cyanosis, clubbing, rash, lesions or edema. NEUROLOGIC: AOx3, no focal motor deficit. SKIN: no jaundice, no rashes  Assessment/Plan 73 year old female with past medical history of MS, ADHD, anxiety, depression, GERD, hyperlipidemia, osteopenia, coming for follow-up of esophagitis.  We will  proceed with EGD.  Toribio Eartha Flavors, MD 04/18/2023, 9:39 AM

## 2023-04-18 NOTE — Anesthesia Procedure Notes (Signed)
 Date/Time: 04/18/2023 10:01 AM  Performed by: Eliodoro Deward FALCON, CRNAPre-anesthesia Checklist: Patient identified, Emergency Drugs available, Suction available and Patient being monitored Patient Re-evaluated:Patient Re-evaluated prior to induction Oxygen Delivery Method: Nasal cannula Induction Type: IV induction Placement Confirmation: positive ETCO2 Comments: Optiflow High Flow [place] O2 used.

## 2023-04-18 NOTE — Anesthesia Postprocedure Evaluation (Signed)
 Anesthesia Post Note  Patient: Jasmine Patton  Procedure(s) Performed: ESOPHAGOGASTRODUODENOSCOPY (EGD) WITH PROPOFOL  BIOPSY  Patient location during evaluation: PACU Anesthesia Type: General Level of consciousness: awake and alert Pain management: pain level controlled Vital Signs Assessment: post-procedure vital signs reviewed and stable Respiratory status: spontaneous breathing, nonlabored ventilation, respiratory function stable and patient connected to nasal cannula oxygen Cardiovascular status: blood pressure returned to baseline and stable Postop Assessment: no apparent nausea or vomiting Anesthetic complications: no   There were no known notable events for this encounter.   Last Vitals:  Vitals:   04/18/23 0806 04/18/23 1020  BP: (!) 145/71 (!) 114/42  Pulse: 81 98  Resp: 18 18  Temp: 36.9 C 36.9 C  SpO2: 99% 98%    Last Pain:  Vitals:   04/18/23 1020  TempSrc: Oral  PainSc: 0-No pain                 Aerilyn Slee L Kevan Prouty

## 2023-04-18 NOTE — Op Note (Signed)
 [facility] Patient Name: Jasmine Patton Procedure Date: 04/18/2023 9:53 AM MRN: [id number] Date of Birth: 04-Jul-1950 Attending MD: Toribio Fortune , , [id number] CSN: [id number] Age: 73 Admit Type: Outpatient Procedure:                Upper GI endoscopy Indications:              Follow-up of reflux esophagitis Providers:                Toribio Fortune, Crystal Page, Kristine L. Shirlean Balm, Technician Referring MD:              Medicines:                Monitored Anesthesia Care Complications:            No immediate complications. Estimated Blood Loss:     Estimated blood loss: none. Procedure:                Pre-Anesthesia Assessment:                           - Prior to the procedure, a History and Physical                            was performed, and patient medications, allergies                            and sensitivities were reviewed. The patient's                            tolerance of previous anesthesia was reviewed.                           - The risks and benefits of the procedure and the                            sedation options and risks were discussed with the                            patient. All questions were answered and informed                            consent was obtained.                           - ASA Grade Assessment: II - A patient with mild                            systemic disease.                           After obtaining informed consent, the endoscope was                            passed under direct vision. Throughout the  procedure, the patient's blood pressure, pulse, and                            oxygen saturations were monitored continuously. The                            GIF-H190 ([id number]) scope was introduced through the                            mouth, and advanced to the second part of duodenum.                            The upper GI endoscopy was accomplished  without                            difficulty. The patient tolerated the procedure                            well. Scope In: 10:09:26 AM Scope Out: 10:14:38 AM Total Procedure Duration: 0 hours 5 minutes 12 seconds  Findings:      The Z-line was irregular and was found 32 cm from the incisors. This was       biopsied with a hot forceps for histology.      A 4 cm hiatal hernia was found. The hiatal narrowing was 36 cm from the       incisors. The Z-line was 32 cm from the incisors.      The gastroesophageal flap valve was visualized endoscopically and       classified as Hill Grade IV (no fold, wide open lumen, hiatal hernia       present).      The stomach was normal.      The examined duodenum was normal. Impression:               - Z-line irregular, 32 cm from the incisors.                            Biopsied.                           - 4 cm hiatal hernia.                           - Gastroesophageal flap valve classified as Hill                            Grade IV (no fold, wide open lumen, hiatal hernia                            present).                           - Normal stomach.                           - Normal examined duodenum. Moderate Sedation:      Per Anesthesia Care Recommendation:           -  Discharge patient to home (ambulatory).                           - Resume previous diet.                           - Await pathology results.                           - Continue present medications.                           - If pathology negative for Barrett's esophagus,                            will discuss combo TIF. Procedure Code(s):        --- Professional ---                           [phone], Esophagogastroduodenoscopy, flexible,                            transoral; with removal of tumor(s), polyp(s), or                            other lesion(s) by hot biopsy forceps Diagnosis Code(s):        --- Professional ---                           K22.89, Other  specified disease of esophagus                           K44.9, Diaphragmatic hernia without obstruction or                            gangrene                           K21.00, Gastro-esophageal reflux disease with                            esophagitis, without bleeding CPT copyright 2022 American Medical Association. All rights reserved. The codes documented in this report are preliminary and upon coder review may  be revised to meet current compliance requirements. Toribio Fortune, MD Toribio Fortune,  04/18/2023 10:24:25 AM This report has been signed electronically. Number of Addenda: 0

## 2023-04-18 NOTE — Discharge Instructions (Signed)
 You are being discharged to home.  Resume your previous diet.  We are waiting for your pathology results.  Continue your present medications.  If pathology negative for Barrett's esophagus, will discuss combo TIF.

## 2023-04-18 NOTE — Anesthesia Preprocedure Evaluation (Addendum)
 Anesthesia Evaluation  Patient identified by MRN, date of birth, ID band Patient awake    Reviewed: Allergy & Precautions, H&P , NPO status , Patient's Chart, lab work & pertinent test results, reviewed documented beta blocker date and time   Airway Mallampati: III  TM Distance: >3 FB Neck ROM: full    Dental no notable dental hx. (+) Dental Advisory Given, Teeth Intact   Pulmonary neg pulmonary ROS   Pulmonary exam normal breath sounds clear to auscultation       Cardiovascular Exercise Tolerance: Good negative cardio ROS  Rhythm:Regular Rate:Normal     Neuro/Psych  PSYCHIATRIC DISORDERS Anxiety Depression    Multiple sclerosis  Neuromuscular disease    GI/Hepatic Neg liver ROS, hiatal hernia,GERD  ,,  Endo/Other  negative endocrine ROS    Renal/GU negative Renal ROS  negative genitourinary   Musculoskeletal   Abdominal Normal abdominal exam  (+)   Peds  Hematology negative hematology ROS (+)   Anesthesia Other Findings   Reproductive/Obstetrics negative OB ROS                             Anesthesia Physical Anesthesia Plan  ASA: 3  Anesthesia Plan: General   Post-op Pain Management: Minimal or no pain anticipated   Induction: Intravenous  PONV Risk Score and Plan:   Airway Management Planned: Nasal Cannula and Natural Airway  Additional Equipment: None  Intra-op Plan:   Post-operative Plan:   Informed Consent: I have reviewed the patients History and Physical, chart, labs and discussed the procedure including the risks, benefits and alternatives for the proposed anesthesia with the patient or authorized representative who has indicated his/her understanding and acceptance.     Dental Advisory Given  Plan Discussed with: CRNA  Anesthesia Plan Comments:        Anesthesia Quick Evaluation

## 2023-04-18 NOTE — Transfer of Care (Signed)
 Immediate Anesthesia Transfer of Care Note  Patient: Jasmine Patton  Procedure(s) Performed: ESOPHAGOGASTRODUODENOSCOPY (EGD) WITH PROPOFOL  BIOPSY  Patient Location: Short Stay  Anesthesia Type:General  Level of Consciousness: awake  Airway & Oxygen Therapy: Patient Spontanous Breathing  Post-op Assessment: Report given to RN and Post -op Vital signs reviewed and stable  Post vital signs: Reviewed and stable  Last Vitals:  Vitals Value Taken Time  BP    Temp    Pulse 92 04/18/23 1019  Resp 17 04/18/23 1019  SpO2 98 % 04/18/23 1019  Vitals shown include unfiled device data.  Last Pain:  Vitals:   04/18/23 1002  TempSrc:   PainSc: 0-No pain      Patients Stated Pain Goal: 6 (04/18/23 0806)  Complications: No notable events documented.

## 2023-04-19 LAB — SURGICAL PATHOLOGY

## 2023-04-20 ENCOUNTER — Encounter (HOSPITAL_COMMUNITY): Payer: Self-pay | Admitting: Gastroenterology

## 2023-04-25 ENCOUNTER — Ambulatory Visit (INDEPENDENT_AMBULATORY_CARE_PROVIDER_SITE_OTHER): Payer: Medicare PPO | Admitting: Gastroenterology

## 2023-05-08 ENCOUNTER — Ambulatory Visit: Payer: Medicare PPO | Admitting: Family Medicine

## 2023-05-22 ENCOUNTER — Ambulatory Visit: Payer: Medicare PPO | Admitting: Nurse Practitioner

## 2023-05-29 ENCOUNTER — Other Ambulatory Visit: Payer: Self-pay | Admitting: Family Medicine

## 2023-05-29 ENCOUNTER — Telehealth: Payer: Self-pay

## 2023-05-29 MED ORDER — PROMETHAZINE-DM 6.25-15 MG/5ML PO SYRP: 5.0000 mL | ORAL_SOLUTION | Freq: Four times a day (QID) | ORAL | 0 refills | Status: DC | PRN

## 2023-05-29 NOTE — Telephone Encounter (Signed)
 Communication  Reason for CRM: Patient saw Dr.Cook on 03/14/23 regarding a cough and the cough has not gotten any better. Patient is going out of town on 2/25 and wants to know if Dr. Adriana Simas and prescribe with promethazine-dextromethorphan (PROMETHAZINE-DM) 6.25-15 MG/5ML syrup or set up a telephone visit. Patient is requesting a call back at [phone].

## 2023-05-30 ENCOUNTER — Encounter: Payer: Self-pay | Admitting: Nurse Practitioner

## 2023-06-01 ENCOUNTER — Other Ambulatory Visit: Payer: Self-pay | Admitting: Nurse Practitioner

## 2023-06-01 MED ORDER — FLUTICASONE-SALMETEROL 100-50 MCG/ACT IN AEPB: 1.0000 | INHALATION_SPRAY | Freq: Two times a day (BID) | RESPIRATORY_TRACT | 2 refills | Status: DC

## 2023-06-20 ENCOUNTER — Encounter: Payer: Self-pay | Admitting: General Surgery

## 2023-06-20 ENCOUNTER — Ambulatory Visit (INDEPENDENT_AMBULATORY_CARE_PROVIDER_SITE_OTHER): Payer: Medicare PPO | Admitting: General Surgery

## 2023-06-20 ENCOUNTER — Other Ambulatory Visit (INDEPENDENT_AMBULATORY_CARE_PROVIDER_SITE_OTHER): Payer: Self-pay

## 2023-06-20 VITALS — BP 133/80 | HR 101 | Temp 98.1°F | Ht 61.0 in | Wt 171.0 lb

## 2023-06-20 DIAGNOSIS — K2100 Gastro-esophageal reflux disease with esophagitis, without bleeding: Secondary | ICD-10-CM

## 2023-06-20 DIAGNOSIS — K449 Diaphragmatic hernia without obstruction or gangrene: Secondary | ICD-10-CM

## 2023-06-20 DIAGNOSIS — K209 Esophagitis, unspecified without bleeding: Secondary | ICD-10-CM

## 2023-06-20 MED ORDER — ESOMEPRAZOLE MAGNESIUM 40 MG PO CPDR: 40.0000 mg | DELAYED_RELEASE_CAPSULE | Freq: Two times a day (BID) | ORAL | 5 refills | Status: AC

## 2023-06-20 NOTE — Progress Notes (Signed)
 Jasmine Patton; [id number]; 1950/11/30   HPI Patient is a 73 year old white female who was referred to my care for evaluation and treatment for a TIF-C procedure.  Patient has had a longstanding history of gastroesophageal reflux disease and was recently noted on EGD to have a 4 cm hiatal hernia.  She is being evaluated for a TIF procedure but would also need closure of the hiatus.  Patient denies having any upper abdominal surgery.  She has tried to control her GERD with PPI and lifestyle adjustments, but it is progressing. Past Medical History:  Diagnosis Date   ADD (attention deficit disorder)    Adult   Allergic rhinitis    Anxiety    Depression    GERD (gastroesophageal reflux disease)    Hyperlipidemia    Insomnia    Chronic   MS (multiple sclerosis) (HCC)    Osteopenia    Positive H. pylori test    Reflux     Past Surgical History:  Procedure Laterality Date   BIOPSY  01/09/2020   Procedure: BIOPSY;  Surgeon: Malissa Hippo, MD;  [facility];  Service: Endoscopy;;   BIOPSY  01/24/2023   Procedure: BIOPSY;  Surgeon: Franky Macho, MD;  [facility];  Service: Endoscopy;;   BIOPSY  04/18/2023   Procedure: BIOPSY;  Surgeon: Dolores Frame, MD;  [facility];  Service: Gastroenterology;;   [facility] STUDY N/A 01/24/2023   Procedure: Ancil Linsey PH STUDY;  Surgeon: Franky Macho, MD;  [facility];  Service: Endoscopy;  Laterality: N/A;  1:45PM;ASA 1-2   BREAST CYST ASPIRATION Left 2004   benign   COLONOSCOPY     COLONOSCOPY N/A 10/12/2016   Procedure: COLONOSCOPY;  Surgeon: Malissa Hippo, MD;  [facility];  Service: Endoscopy;  Laterality: N/A;   COLONOSCOPY N/A 01/09/2020   Procedure: COLONOSCOPY;  Surgeon: Malissa Hippo, MD;  [facility];  Service: Endoscopy;  Laterality: N/A;  100   Cyst removed from left breast     DILATION AND CURETTAGE OF UTERUS     ESOPHAGEAL DILATION N/A  01/09/2020   Procedure: ESOPHAGEAL DILATION;  Surgeon: Malissa Hippo, MD;  [facility];  Service: Endoscopy;  Laterality: N/A;   ESOPHAGOGASTRODUODENOSCOPY     ESOPHAGOGASTRODUODENOSCOPY N/A 10/12/2016   Procedure: ESOPHAGOGASTRODUODENOSCOPY (EGD);  Surgeon: Malissa Hippo, MD;  [facility];  Service: Endoscopy;  Laterality: N/A;  1:45   ESOPHAGOGASTRODUODENOSCOPY N/A 01/09/2020   Procedure: ESOPHAGOGASTRODUODENOSCOPY (EGD);  Surgeon: Malissa Hippo, MD;  [facility];  Service: Endoscopy;  Laterality: N/A;   ESOPHAGOGASTRODUODENOSCOPY (EGD) WITH PROPOFOL N/A 01/24/2023   Procedure: ESOPHAGOGASTRODUODENOSCOPY (EGD) WITH PROPOFOL;  Surgeon: Franky Macho, MD;  [facility];  Service: Endoscopy;  Laterality: N/A;  1:45PM;ASA 1-2   ESOPHAGOGASTRODUODENOSCOPY (EGD) WITH PROPOFOL N/A 04/18/2023   Procedure: ESOPHAGOGASTRODUODENOSCOPY (EGD) WITH PROPOFOL;  Surgeon: Dolores Frame, MD;  [facility];  Service: Gastroenterology;  Laterality: N/A;  9:45am;asa 1-2   POLYPECTOMY  10/12/2016   Procedure: POLYPECTOMY;  Surgeon: Malissa Hippo, MD;  [facility];  Service: Endoscopy;;  cecal    POLYPECTOMY  01/09/2020   Procedure: POLYPECTOMY;  Surgeon: Malissa Hippo, MD;  [facility];  Service: Endoscopy;;    Family History  Problem Relation Age of Onset   Cancer Mother        Colon   Colon cancer Mother  Hypertension Father     Current Outpatient Medications on File Prior to Visit  Medication Sig Dispense Refill   amphetamine-dextroamphetamine (ADDERALL) 10 MG tablet Take 1 tablet once or twice a day as needed 60 tablet 0   Calcium Carbonate Antacid (MAALOX) 600 MG chewable tablet Chew 600 mg by mouth at bedtime.     Cholecalciferol 50 MCG (2000 UT) TABS Take 2,000 Units by mouth daily.      clonazePAM (KLONOPIN) 0.5 MG tablet TAKE ONE TABLET BY MOUTH ONCE A DAY AS NEEDED FOR INSOMNIA 30  tablet 3   diphenoxylate-atropine (LOMOTIL) 2.5-0.025 MG tablet Take 1 tablet by mouth 4 (four) times daily as needed for diarrhea or loose stools. 180 tablet 1   doxepin (SINEQUAN) 50 MG capsule TAKE ONE CAPSULE (50 MG TOTAL) BY MOUTH AT BEDTIME. 90 capsule 3   Melatonin 300 MCG TABS Take 300 mcg by mouth at bedtime.     Probiotic Product (PROBIOTIC ADVANCED PO) Take by mouth daily at 6 (six) AM.     rosuvastatin (CRESTOR) 20 MG tablet TAKE ONE TABLET (20MG  TOTAL) BY MOUTH DAILY 90 tablet 3   tiZANidine (ZANAFLEX) 4 MG tablet TAKE ONE TABLET (4MG  TOTAL) BY MOUTH DAILY AS NEEDED FOR MUSCLE SPASMS 30 tablet 1   No current facility-administered medications on file prior to visit.    Allergies  Allergen Reactions   Ambien [Zolpidem Tartrate] Other (See Comments)    forgetfulness    Etodolac Rash    Social History   Substance and Sexual Activity  Alcohol Use Yes   Comment: Socially    Social History   Tobacco Use  Smoking Status Never  Smokeless Tobacco Never    Review of Systems  Constitutional: Negative.   HENT: Negative.    Eyes: Negative.   Respiratory: Negative.    Cardiovascular: Negative.   Gastrointestinal:  Positive for heartburn.  Genitourinary: Negative.   Musculoskeletal:  Positive for back pain and joint pain.  Skin: Negative.   Neurological:  Positive for dizziness.  Endo/Heme/Allergies: Negative.   Psychiatric/Behavioral: Negative.      Objective   Vitals:   06/20/23 1255  BP: 133/80  Pulse: (!) 101  Temp: 98.1 F (36.7 C)  SpO2: 93%    Physical Exam Vitals reviewed.  Constitutional:      Appearance: Normal appearance. She is not ill-appearing.  HENT:     Head: Normocephalic and atraumatic.  Cardiovascular:     Rate and Rhythm: Normal rate and regular rhythm.     Heart sounds: Normal heart sounds. No murmur heard.    No friction rub. No gallop.  Pulmonary:     Effort: Pulmonary effort is normal. No respiratory distress.     Breath  sounds: Normal breath sounds. No stridor. No wheezing, rhonchi or rales.  Abdominal:     General: Bowel sounds are normal. There is no distension.     Palpations: Abdomen is soft. There is no mass.     Tenderness: There is no abdominal tenderness. There is no guarding or rebound.     Hernia: No hernia is present.  Skin:    General: Skin is warm and dry.  Neurological:     Mental Status: She is alert and oriented to person, place, and time.   EGD notes reviewed  Assessment  Intractable GERD Plan  The robotic assisted laparoscopic hiatal repair was fully explained to the patient.  The risks and benefits including bleeding, infection, and perforation were fully explained to the patient, who  gave informed consent.  Will coordinate with Dr. Levon Hedger the timing of surgery.

## 2023-06-26 ENCOUNTER — Telehealth (INDEPENDENT_AMBULATORY_CARE_PROVIDER_SITE_OTHER): Payer: Self-pay | Admitting: Gastroenterology

## 2023-06-26 ENCOUNTER — Encounter (INDEPENDENT_AMBULATORY_CARE_PROVIDER_SITE_OTHER): Payer: Self-pay

## 2023-06-26 ENCOUNTER — Encounter (INDEPENDENT_AMBULATORY_CARE_PROVIDER_SITE_OTHER): Payer: Self-pay | Admitting: Gastroenterology

## 2023-06-26 NOTE — Telephone Encounter (Signed)
 Message sent to [facility] at Dr.Jenkins office-Dr.Castaneda wants to do a TIF combo with Dr.Jenkins on this pt. he has asked me to hold a slot on 4/23. do you all have an early morning appt that day so she can be the first pt of the day?

## 2023-06-26 NOTE — Telephone Encounter (Signed)
 Pt was scheduled for 4/23 but will be out of town and pt would like to have done in July. Per Dr.Castaneda 7/2 at 7:30 with dr.jenkins. need to save 90 minute slot starting at 9:30am

## 2023-06-26 NOTE — Addendum Note (Signed)
 Addended by: Phillips Odor on: 06/26/2023 01:57 PM   Modules accepted: Orders

## 2023-07-01 ENCOUNTER — Other Ambulatory Visit: Payer: Self-pay | Admitting: Family Medicine

## 2023-07-05 ENCOUNTER — Ambulatory Visit: Payer: Self-pay | Admitting: *Deleted

## 2023-07-05 NOTE — Telephone Encounter (Signed)
 Noted.

## 2023-07-05 NOTE — Telephone Encounter (Signed)
 Copied from CRM [id number]. Topic: Clinical - Red Word Triage >> Jul 05, 2023 10:41 AM Emylou G wrote: [facility] that prompted transfer to Nurse Triage: asthma attack Reason for Disposition  [1] Continuous (nonstop) coughing interferes with work or school AND [2] no improvement using cough treatment per Care Advice  Answer Assessment - Initial Assessment Questions 1. ONSET: "When did the cough begin?"      I'm coughing up green/yellow mucus. 2. SEVERITY: "How bad is the cough today?"      It's a bad cough.   Talking makes me cough. I was seen 2 weeks and prescribe an inhaler which helped.   I've almost used it.  I stopped taking it.    Over the last week the cough has come back and and is worse.   I woke up during the night and was having trouble breathing in.  No wheezing.   I woke up and tried to cough and tried to take in a big breath strange noise came out. 3. SPUTUM: "Describe the color of your sputum" (none, dry cough; clear, white, yellow, green)     Yellow/ green 4. HEMOPTYSIS: "Are you coughing up any blood?" If so ask: "How much?" (flecks, streaks, tablespoons, etc.)     Not asked 5. DIFFICULTY BREATHING: "Are you having difficulty breathing?" If Yes, ask: "How bad is it?" (e.g., mild, moderate, severe)    - MILD: No SOB at rest, mild SOB with walking, speaks normally in sentences, can lie down, no retractions, pulse < 100.    - MODERATE: SOB at rest, SOB with minimal exertion and prefers to sit, cannot lie down flat, speaks in phrases, mild retractions, audible wheezing, pulse 100-120.    - SEVERE: Very SOB at rest, speaks in single words, struggling to breathe, sitting hunched forward, retractions, pulse > 120      No shortness of breath just with coughing. No appetite and no energy. 6. FEVER: "Do you have a fever?" If Yes, ask: "What is your temperature, how was it measured, and when did it start?"     No 7. CARDIAC HISTORY: "Do you have any history of heart disease?" (e.g., heart  attack, congestive heart failure)      Not asked 8. LUNG HISTORY: "Do you have any history of lung disease?"  (e.g., pulmonary embolus, asthma, emphysema)    Not asked 9. PE RISK FACTORS: "Do you have a history of blood clots?" (or: recent major surgery, recent prolonged travel, bedridden)     Not asked 10. OTHER SYMPTOMS: "Do you have any other symptoms?" (e.g., runny nose, wheezing, chest pain)       Coughing 11. PREGNANCY: "Is there any chance you are pregnant?" "When was your last menstrual period?"       N/A due to age 73. TRAVEL: "Have you traveled out of the country in the last month?" (e.g., travel history, exposures)       N/A but getting ready to go out of town  Protocols used: Cough - Acute Productive-A-AH  Chief Complaint: Cough went away but it has come back and is worse.   Keeping her up at night. Symptoms: Coughing up green/yellow mucus.   Does not have asthma.   Frequency: For the last week but the cough is getting worse.   Getting ready to go out of town either [place]. Or Fri.   She doesn't have much appetite and is tired.    Pertinent Negatives: Patient denies fever. Disposition: [] ED /[] Urgent Care (no  appt availability in office) / [x] Appointment(In office/virtual)/ []  [facility]/ [] Home Care/ [] Refused Recommended Disposition /[]  Mobile Bus/ []  Follow-up with PCP Additional Notes: Appt made with Dr Adriana Simas for 07/06/2023 at 9:20.

## 2023-07-06 ENCOUNTER — Ambulatory Visit: Admitting: Family Medicine

## 2023-07-06 VITALS — BP 132/82 | HR 94 | Temp 97.5°F | Ht 61.0 in | Wt 167.6 lb

## 2023-07-06 DIAGNOSIS — J209 Acute bronchitis, unspecified: Secondary | ICD-10-CM | POA: Diagnosis not present

## 2023-07-06 MED ORDER — AMOXICILLIN-POT CLAVULANATE 875-125 MG PO TABS: 1.0000 | ORAL_TABLET | Freq: Two times a day (BID) | ORAL | 0 refills | Status: DC

## 2023-07-06 MED ORDER — PROMETHAZINE-DM 6.25-15 MG/5ML PO SYRP: 5.0000 mL | ORAL_SOLUTION | Freq: Four times a day (QID) | ORAL | 0 refills | Status: DC | PRN

## 2023-07-06 NOTE — Progress Notes (Signed)
 Subjective:  Patient ID: Jasmine Patton, female    DOB: [birth year]  Age: 73 y.o. MRN: [id number]  CC:   Chief Complaint  Patient presents with   Cough    Productive Cough, wheezing x's 2 weeks     HPI:  73 year old female presents for evaluation of the above.  Patient reports a 2-week history of cough.  Associated wheezing.  Intermittently productive.  She has used Nasacort and Advair without resolution.  No fever.  She is quite troubled by her symptoms and lack of improvement.  No other associated symptoms.  No other complaints.  Patient Active Problem List   Diagnosis Date Noted   Acute bronchitis 07/06/2023   Esophagitis 04/18/2023   Gastritis and gastroduodenitis 01/24/2023   Gastroesophageal reflux disease with esophagitis without hemorrhage 01/24/2023   Hiatal hernia 11/28/2022   Irritable bowel syndrome with mixed bowel habits 06/13/2022   Prediabetes 09/25/2021   Muscle spasms of both lower extremities 06/11/2020   Hyperlipidemia 12/30/2016   Insomnia 02/09/2014   Anxiety and depression 05/05/2009   Multiple sclerosis (HCC) 05/05/2009    Social Hx   Social History   Socioeconomic History   Marital status: Married    Spouse name: Not on file   Number of children: Not on file   Years of education: Not on file   Highest education level: Bachelor's degree (e.g., BA, AB, BS)  Occupational History   Not on file  Tobacco Use   Smoking status: Never   Smokeless tobacco: Never  Vaping Use   Vaping status: Never Used  Substance and Sexual Activity   Alcohol use: Yes    Comment: Socially   Drug use: No   Sexual activity: Not on file  Other Topics Concern   Not on file  Social History Narrative   Not on file   Social Drivers of Health   Financial Resource Strain: Low Risk  (04/10/2023)   Overall Financial Resource Strain (CARDIA)    Difficulty of Paying Living Expenses: Not hard at all  Food Insecurity: No Food Insecurity (04/10/2023)   Hunger Vital Sign     Worried About Running Out of Food in the Last Year: Never true    Ran Out of Food in the Last Year: Never true  Transportation Needs: No Transportation Needs (04/10/2023)   PRAPARE - Administrator, Civil Service (Medical): No    Lack of Transportation (Non-Medical): No  Physical Activity: Insufficiently Active (04/10/2023)   Exercise Vital Sign    Days of Exercise per Week: 1 day    Minutes of Exercise per Session: 20 min  Stress: No Stress Concern Present (04/10/2023)   [employer] of Occupational Health - Occupational Stress Questionnaire    Feeling of Stress : Only a little  Social Connections: Moderately Integrated (04/10/2023)   Social Connection and Isolation Panel [NHANES]    Frequency of Communication with Friends and Family: More than three times a week    Frequency of Social Gatherings with Friends and Family: Once a week    Attends Religious Services: More than 4 times per year    Active Member of [employer] or Organizations: No    Attends Engineer, structural: Not on file    Marital Status: Married    Review of Systems Per HPI  Objective:  BP 132/82   Pulse 94   Temp (!) 97.5 F (36.4 C)   Ht 5\' 1"  (1.549 m)   Wt 167 lb 9.6 oz (76 kg)  SpO2 96%   BMI 31.67 kg/m      07/06/2023    9:27 AM 06/20/2023   12:55 PM 04/18/2023   10:20 AM  BP/Weight  Systolic BP 132 133 114  Diastolic BP 82 80 42  Wt. (Lbs) 167.6 171   BMI 31.67 kg/m2 32.31 kg/m2     Physical Exam Vitals and nursing note reviewed.  Constitutional:      General: She is not in acute distress.    Appearance: Normal appearance.  HENT:     Head: Normocephalic and atraumatic.     Mouth/Throat:     Pharynx: Oropharynx is clear.  Eyes:     General:        Right eye: No discharge.        Left eye: No discharge.     Conjunctiva/sclera: Conjunctivae normal.  Cardiovascular:     Rate and Rhythm: Normal rate and regular rhythm.  Pulmonary:     Effort: Pulmonary effort is normal.      Breath sounds: Normal breath sounds. No wheezing or rales.  Neurological:     Mental Status: She is alert.     Lab Results  Component Value Date   WBC 7.3 11/15/2022   HGB 12.6 11/15/2022   HCT 38.4 11/15/2022   PLT 341 11/15/2022   GLUCOSE 125 (H) 11/15/2022   CHOL 170 11/15/2022   TRIG 178 (H) 11/15/2022   HDL 62 11/15/2022   LDLCALC 78 11/15/2022   ALT 12 11/15/2022   AST 19 11/15/2022   NA 140 11/15/2022   K 4.6 11/15/2022   CL 102 11/15/2022   CREATININE 0.99 11/15/2022   BUN 10 11/15/2022   CO2 25 11/15/2022   TSH 1.920 11/15/2022   HGBA1C 5.7 (H) 11/15/2022     Assessment & Plan:  Acute bronchitis, unspecified organism Assessment & Plan: Treating with Augmentin.    Other orders -     Amoxicillin-Pot Clavulanate; Take 1 tablet by mouth 2 (two) times daily.  Dispense: 14 tablet; Refill: 0 -     Promethazine-DM; Take 5 mLs by mouth 4 (four) times daily as needed for cough.  Dispense: 118 mL; Refill: 0   Follow-up:  Return if symptoms worsen or fail to improve.  Everlene Other DO [facility]

## 2023-07-06 NOTE — Assessment & Plan Note (Signed)
 Treating with Augmentin.

## 2023-07-17 ENCOUNTER — Other Ambulatory Visit: Payer: Self-pay | Admitting: Family Medicine

## 2023-07-17 DIAGNOSIS — F5101 Primary insomnia: Secondary | ICD-10-CM

## 2023-07-17 DIAGNOSIS — F32A Depression, unspecified: Secondary | ICD-10-CM

## 2023-07-26 SURGERY — FUNDOPLICATION, STOMACH, INCISIONLESS, ORAL APPROACH
Anesthesia: Choice

## 2023-08-09 ENCOUNTER — Encounter (INDEPENDENT_AMBULATORY_CARE_PROVIDER_SITE_OTHER): Payer: Self-pay | Admitting: Gastroenterology

## 2023-08-09 ENCOUNTER — Other Ambulatory Visit: Payer: Self-pay | Admitting: Family Medicine

## 2023-08-09 DIAGNOSIS — F5101 Primary insomnia: Secondary | ICD-10-CM

## 2023-08-10 ENCOUNTER — Other Ambulatory Visit: Payer: Self-pay

## 2023-08-10 DIAGNOSIS — F5101 Primary insomnia: Secondary | ICD-10-CM

## 2023-08-10 MED ORDER — DOXEPIN HCL 50 MG PO CAPS: ORAL_CAPSULE | ORAL | 3 refills | Status: AC

## 2023-08-11 ENCOUNTER — Other Ambulatory Visit: Payer: Self-pay | Admitting: Neurology

## 2023-08-11 NOTE — Telephone Encounter (Signed)
 Requested Prescriptions   Pending Prescriptions Disp Refills   amphetamine -dextroamphetamine  (ADDERALL) 10 MG tablet [Pharmacy Med Name: AMPHETAMINE -DEXTROAMPHETAMINE  10 MG] 60 tablet     Sig: TAKE ONE TABLET ONCE OR TWICE A DAY AS NEEDED   Last seen 01/10/23, next appt scheduled 01/10/24 Dispenses   Dispensed Days Supply Quantity Provider Pharmacy  dextroamphetamine -amphetamine  10 mg tablet 01/10/2023 30 60 tablet Sater, Sherida Dimmer, MD [facility] - ...    Lat note from Dr. Godwin Lat stated: Low dose Adderall once or twice a day prn.  ROUTING TO DR Gracie Lav AS NO WORK IN PROVIDER WAS LISTED FOR 08/11/23 (FRIDAY)

## 2023-08-17 ENCOUNTER — Other Ambulatory Visit: Payer: Self-pay | Admitting: Family Medicine

## 2023-08-17 MED ORDER — PROMETHAZINE-DM 6.25-15 MG/5ML PO SYRP: 5.0000 mL | ORAL_SOLUTION | Freq: Four times a day (QID) | ORAL | 0 refills | Status: DC | PRN

## 2023-09-12 ENCOUNTER — Telehealth: Payer: Self-pay | Admitting: *Deleted

## 2023-09-12 NOTE — Telephone Encounter (Signed)
 REPAIR, HERNIA, PARAESOPHAGEAL, ROBOT-ASSISTED- MARK JENKINS, MD FUNDOPLICATION, STOMACH, INCISIONLESS, ORAL APPROACH- Samantha Cress, MD  Procedure cancelled per patient request.

## 2023-09-12 NOTE — Telephone Encounter (Signed)
 Received message from Dr. Sammi Crick patient wants to cancel her TIFF 7/2. Message sent to endo making aware

## 2023-10-02 ENCOUNTER — Other Ambulatory Visit (HOSPITAL_COMMUNITY)

## 2023-10-04 ENCOUNTER — Ambulatory Visit (HOSPITAL_COMMUNITY): Admit: 2023-10-04 | Admitting: General Surgery

## 2023-10-04 SURGERY — REPAIR, HERNIA, PARAESOPHAGEAL, ROBOT-ASSISTED
Anesthesia: General

## 2023-10-16 ENCOUNTER — Encounter: Payer: Self-pay | Admitting: Nurse Practitioner

## 2023-10-18 ENCOUNTER — Other Ambulatory Visit: Payer: Self-pay | Admitting: Nurse Practitioner

## 2023-10-18 ENCOUNTER — Encounter: Payer: Self-pay | Admitting: Nurse Practitioner

## 2023-10-18 DIAGNOSIS — B369 Superficial mycosis, unspecified: Secondary | ICD-10-CM

## 2023-10-18 MED ORDER — KETOCONAZOLE 2 % EX CREA: 1.0000 | TOPICAL_CREAM | Freq: Two times a day (BID) | CUTANEOUS | 0 refills | Status: AC

## 2023-10-19 ENCOUNTER — Ambulatory Visit: Admitting: Nurse Practitioner

## 2023-11-02 ENCOUNTER — Ambulatory Visit: Admitting: Nurse Practitioner

## 2023-11-02 VITALS — BP 127/77 | HR 94 | Temp 97.9°F | Ht 61.0 in | Wt 170.0 lb

## 2023-11-02 DIAGNOSIS — R300 Dysuria: Secondary | ICD-10-CM

## 2023-11-02 DIAGNOSIS — B369 Superficial mycosis, unspecified: Secondary | ICD-10-CM

## 2023-11-02 DIAGNOSIS — N3001 Acute cystitis with hematuria: Secondary | ICD-10-CM | POA: Diagnosis not present

## 2023-11-02 LAB — POCT URINALYSIS DIP (CLINITEK)
Bilirubin, UA: NEGATIVE
Glucose, UA: NEGATIVE mg/dL
Ketones, POC UA: NEGATIVE mg/dL
Nitrite, UA: POSITIVE — AB
POC PROTEIN,UA: 30 — AB
Spec Grav, UA: 1.015 (ref 1.010–1.025)
Urobilinogen, UA: 0.2 U/dL
pH, UA: 6 (ref 5.0–8.0)

## 2023-11-02 LAB — POCT UA - MICROSCOPIC ONLY

## 2023-11-02 MED ORDER — CLOTRIMAZOLE-BETAMETHASONE 1-0.05 % EX CREA: 1.0000 | TOPICAL_CREAM | Freq: Two times a day (BID) | CUTANEOUS | 0 refills | Status: AC

## 2023-11-02 MED ORDER — CEFDINIR 300 MG PO CAPS: 300.0000 mg | ORAL_CAPSULE | Freq: Two times a day (BID) | ORAL | 0 refills | Status: AC

## 2023-11-02 NOTE — Progress Notes (Signed)
 Subjective:    Patient ID: Jasmine Patton, female    DOB: October 02, 1950, 73 y.o.   MRN: [id number]  HPI Presents for complaints of urinary symptoms over the past several days.  No fever.  Dysuria with urgency and frequency.  No history of recent UTI.  No pelvic pain.  No mid back or flank pain.  Has not tried anything specific for her symptoms.  Drinking plenty of fluids.  Also has had a flareup of the rash under her breast and in the private area over the past 3 weeks.  Has had problems with this in the past.  Was prescribed oral fluconazole  on 10/18/2023 and has been applying ketoconazole  twice a day.  Has seen some slight improvement but continues to experience significant problems.  Area is very pruritic.  Mainly some burning sensation when she tries to bathe.  Has been trying to let the areas air out as much as possible.   Review of Systems See HPI    Objective:   Physical Exam Vitals and nursing note reviewed. Chaperone present: Defers chaperone.  Constitutional:      General: She is not in acute distress. Cardiovascular:     Rate and Rhythm: Normal rate and regular rhythm.  Pulmonary:     Effort: Pulmonary effort is normal.     Breath sounds: Normal breath sounds.  Abdominal:     General: There is no distension.     Palpations: Abdomen is soft.     Tenderness: There is no abdominal tenderness. There is no right CVA tenderness or left CVA tenderness.  Skin:    Findings: Rash present.     Comments: Confluent mildly erythematous shiny rash noted in the the skin folds around the upper inner thigh and along the outer labia. Very inflamed similar rash noted under the right breast.   Neurological:     Mental Status: She is alert and oriented to person, place, and time.  Psychiatric:        Mood and Affect: Mood normal.        Behavior: Behavior normal.        Thought Content: Thought content normal.        Judgment: Judgment normal.    Today's Vitals   11/02/23 1551  BP: 127/77   Pulse: 94  Temp: 97.9 F (36.6 C)  SpO2: 97%  Weight: 170 lb (77.1 kg)  Height: 5' 1 (1.549 m)   Body mass index is 32.12 kg/m.    Results for orders placed or performed in visit on 11/02/23  POCT URINALYSIS DIP (CLINITEK)   Collection Time: 11/02/23  3:46 PM  Result Value Ref Range   Color, UA yellow yellow   Clarity, UA cloudy (A) clear   Glucose, UA negative negative mg/dL   Bilirubin, UA negative negative   Ketones, POC UA negative negative mg/dL   Spec Grav, UA 8.984 8.989 - 1.025   Blood, UA large (A) negative   pH, UA 6.0 5.0 - 8.0   POC PROTEIN,UA =30 (A) negative, trace   Urobilinogen, UA 0.2 0.2 or 1.0 E.U./dL   Nitrite, UA Positive (A) Negative   Leukocytes, UA Moderate (2+) (A) Negative  POCT UA - Microscopic Only   Collection Time: 11/02/23  4:57 PM  Result Value Ref Range   WBC, Ur, HPF, POC TNTC 0 - 5   RBC, Urine, Miroscopic TNTC 0 - 2   Bacteria, U Microscopic many None - Trace   Mucus, UA  Epithelial cells, urine per micros none    Crystals, Ur, HPF, POC     Casts, Ur, LPF, POC     Yeast, UA     11/15/2022: Hgb A1c was 5.7%     Assessment & Plan:   Problem List Items Addressed This Visit   None Visit Diagnoses       Dysuria    -  Primary   Relevant Orders   POCT URINALYSIS DIP (CLINITEK) (Completed)   Urine Culture   POCT UA - Microscopic Only (Completed)     Acute cystitis with hematuria       Relevant Orders   POCT UA - Microscopic Only (Completed)     Fungal skin infection       Relevant Medications   clotrimazole -betamethasone  (LOTRISONE ) cream   cefdinir  (OMNICEF ) 300 MG capsule      Meds ordered this encounter  Medications   clotrimazole -betamethasone  (LOTRISONE ) cream    Sig: Apply 1 Application topically 2 (two) times daily. Prn rash    Dispense:  45 g    Refill:  0    Supervising Provider:   ALPHONSA HAMILTON A [[id number]]   cefdinir  (OMNICEF ) 300 MG capsule    Sig: Take 1 capsule (300 mg total) by mouth 2 (two) times  daily.    Dispense:  14 capsule    Refill:  0    Supervising Provider:   ALPHONSA HAMILTON A [id number]   Urine sent for culture. Start Cefdinir  as directed. Use AZO for 48 hours then DC.  Warning signs reviewed. Go to ED or UC over the weekend if worse. Call back early next week if no better.  Start Lotrisone  cream as directed. Patient states this worked well before.  Keep area clean and dry and allow air as much as possible.  Return if symptoms worsen or fail to improve.

## 2023-11-06 ENCOUNTER — Ambulatory Visit: Payer: Self-pay | Admitting: Nurse Practitioner

## 2023-11-06 LAB — URINE CULTURE

## 2023-11-14 ENCOUNTER — Other Ambulatory Visit: Payer: Self-pay | Admitting: Family Medicine

## 2023-11-14 DIAGNOSIS — F5101 Primary insomnia: Secondary | ICD-10-CM

## 2023-11-14 DIAGNOSIS — F32A Depression, unspecified: Secondary | ICD-10-CM

## 2023-12-15 ENCOUNTER — Other Ambulatory Visit: Payer: Self-pay | Admitting: Family Medicine

## 2023-12-15 DIAGNOSIS — M62838 Other muscle spasm: Secondary | ICD-10-CM

## 2024-01-10 ENCOUNTER — Ambulatory Visit: Payer: Medicare PPO | Admitting: Neurology

## 2024-01-10 ENCOUNTER — Encounter: Payer: Self-pay | Admitting: Neurology

## 2024-01-10 VITALS — BP 149/71 | HR 97 | Ht 61.0 in | Wt 171.5 lb

## 2024-01-10 DIAGNOSIS — G35D Multiple sclerosis, unspecified: Secondary | ICD-10-CM | POA: Diagnosis not present

## 2024-01-10 DIAGNOSIS — R5383 Other fatigue: Secondary | ICD-10-CM

## 2024-01-10 DIAGNOSIS — R197 Diarrhea, unspecified: Secondary | ICD-10-CM

## 2024-01-10 DIAGNOSIS — F419 Anxiety disorder, unspecified: Secondary | ICD-10-CM

## 2024-01-10 DIAGNOSIS — F32A Depression, unspecified: Secondary | ICD-10-CM

## 2024-01-10 DIAGNOSIS — N3941 Urge incontinence: Secondary | ICD-10-CM | POA: Diagnosis not present

## 2024-01-10 DIAGNOSIS — F5101 Primary insomnia: Secondary | ICD-10-CM

## 2024-01-10 DIAGNOSIS — G4721 Circadian rhythm sleep disorder, delayed sleep phase type: Secondary | ICD-10-CM | POA: Diagnosis not present

## 2024-01-10 MED ORDER — DESVENLAFAXINE SUCCINATE ER 100 MG PO TB24: 100.0000 mg | ORAL_TABLET | Freq: Every day | ORAL | 3 refills | Status: AC

## 2024-01-10 MED ORDER — AMPHETAMINE-DEXTROAMPHETAMINE 10 MG PO TABS: ORAL_TABLET | ORAL | 0 refills | Status: DC

## 2024-01-10 NOTE — Progress Notes (Signed)
 [facility]  PATIENT: Jasmine Patton DOB: 1950/06/09  REFERRING DOCTOR OR PCP:  Dr. Jacqulyn Ahle, DO; Elveria Quarry, NP SOURCE: Patient, records from [facility] neurology, imaging and lab reports, MRI images have been requested.  _________________________________   HISTORICAL  CHIEF COMPLAINT:  Chief Complaint  Patient presents with   RM11/MS    Pt is here with her Husband. Pt states that she has been in bed a lot lately. Pt states that she has been sleeping a lot lately. Pt states she has had a few falls within the past couple.     HISTORY OF PRESENT ILLNESS:  Jasmine Patton is a 73 y.o. woman who was diagnosed with MS in 1996.    She is not currently on any disease modifying therapy.  She last took Betaseron in 2009.  UPDATE 01/10/2024: She feels the MS is stable though has had some depression.      Currently, she has a mild gait disturbance.    She is off balanced.  She feels she cannot lift legs high (left worse).   She has difficulty with stairs and uses the rails.   She has no recent hard fall but has some stumbles.   She has left > right weakness and numbness.  Denies spasticity.  She has some dysesthesias in her calves bilaterally.  She has mild urge incontinence and bowel incontinence.     Bowel issues were felt to be related to constipation and she uses Miralax.      She never had optic neuritis   She has improvement since cataract surgery.      Since her initial exacerbation, she has had significant fatigue. She sleeps better if she takes clonazepam  but tries not to use every night.   She has developed a delayed phase.    She reads at night and does emails on phone.   She goes to bed around midnight but tries some noghts to go to sleep earlier.   She takes naps during the day - especially if she takes tizanidine . SABRA  In the past she was on Adderall 10 mg as needed and took sparingly.  This helped her on days where she needed to focus more or when she  had more tiredness.    She had depression in the past and was on Pristiq .  She felt she did better on it and would like to go back on.  Cognition has done well.  She has some hip pain on the right.      MS HISTORY MS history (modified from Dr. Annelle Zeid's note 01/18/2018):  Her initial symptoms were in October 1996, she developed numbness from her elbow down into her hand on the left side progressing over several days she had numbness on the entire left side with mild weakness, and some impairment of balance. She had a Lhermtte sign.   Her evaluation at that time showed a normal brain MRI, and 2 lesions seen on her cervical spine, her spinal tap per report showed elevated IgG synthesis rate, and positive oligoclonal bands. She was subsequent he diagnosed with multiple sclerosis, she was treated with IV steroids, she did improve but was left with mild deficits with left sided sensory changes and mild impairment of balance. She also had a lot of fatigue.   She does not recall any specific relapses since 1996 but has had intermittent neurologic symptoms.   She was treated with Avonex in 1997, and then was switched to Rebif and  then to Betaseron, she continued on immunotherapy until 2009. She stopped Betaseron in 2009 due to significant injection site reactions and due to flulike symptoms. She has not had any definite clinical relapses since stopping the Betaseron. Her last MRI is from 2010 and at that time was stable. She reports that since 2009 she has had some mild and very slowly worsening changes in her balance. She reports that her left leg weakness has been stable, and reports that overall she can walk about the same distance over the last few years. She does not use any assistive devices on a regular basis.    Imaging: MRI of the brain 08/11/2015 report mentions T2/FLAIR hyperintense foci in the hemispheres and cerebellum consistent with chronic demyelinating plaque.  Compared to an MRI from 2008, a  couple foci were more conspicuous or new.  There were no enhancing lesions.  MRI of the cervical spine report 08/11/2015 showed a T2 hyperintense focus adjacent to C1-C2 posteriorly to the left.  Degenerative changes noted at C4-C5 causing mild spinal stenosis and mild to moderate foraminal narrowing.  C5-C6 causing mild to moderate spinal stenosis but no significant foraminal narrowing..  There are degenerative changes at C6-C7 and C7-T1  REVIEW OF SYSTEMS: Constitutional: No fevers, chills, sweats, or change in appetite Eyes: No visual changes, double vision, eye pain Ear, nose and throat: No hearing loss, ear pain, nasal congestion, sore throat Cardiovascular: No chest pain, palpitations Respiratory:  No shortness of breath at rest or with exertion.   No wheezes GastrointestinaI: No nausea, vomiting, diarrhea, abdominal pain, fecal incontinence Genitourinary:  No dysuria, urinary retention or frequency.  No nocturia. Musculoskeletal:  No neck pain, back pain Integumentary: No rash, pruritus, skin lesions Neurological: as above Psychiatric: No depression at this time.  No anxiety Endocrine: No palpitations, diaphoresis, change in appetite, change in weigh or increased thirst Hematologic/Lymphatic:  No anemia, purpura, petechiae. Allergic/Immunologic: No itchy/runny eyes, nasal congestion, recent allergic reactions, rashes  ALLERGIES: Allergies  Allergen Reactions   Ambien [Zolpidem Tartrate] Other (See Comments)    forgetfulness    Etodolac Rash    HOME MEDICATIONS:  Current Outpatient Medications:    Calcium  Carbonate Antacid (MAALOX) 600 MG chewable tablet, Chew 600 mg by mouth at bedtime., Disp: , Rfl:    Cholecalciferol 50 MCG (2000 UT) TABS, Take 2,000 Units by mouth daily. , Disp: , Rfl:    clonazePAM  (KLONOPIN ) 0.5 MG tablet, TAKE ONE (1) TABLET BY MOUTH EVERY DAY AS NEEDED FOR INSOMNIA, Disp: 30 tablet, Rfl: 3   clotrimazole -betamethasone  (LOTRISONE ) cream, Apply 1  Application topically 2 (two) times daily. Prn rash, Disp: 45 g, Rfl: 0   desvenlafaxine  (PRISTIQ ) 100 MG 24 hr tablet, Take 1 tablet (100 mg total) by mouth daily., Disp: 90 tablet, Rfl: 3   doxepin  (SINEQUAN ) 50 MG capsule, TAKE ONE CAPSULE (50 MG TOTAL) BY MOUTH AT BEDTIME., Disp: 90 capsule, Rfl: 3   esomeprazole  (NEXIUM ) 40 MG capsule, Take 1 capsule (40 mg total) by mouth 2 (two) times daily before a meal. TAKE ONE CAPSULE (40MG  TOTAL) BY MOUTH TWO TIMES A DAY BEFORE A MEAL, Disp: 60 capsule, Rfl: 5   Melatonin 300 MCG TABS, Take 300 mcg by mouth at bedtime., Disp: , Rfl:    Probiotic Product (PROBIOTIC ADVANCED PO), Take by mouth daily at 6 (six) AM., Disp: , Rfl:    rosuvastatin  (CRESTOR ) 20 MG tablet, TAKE ONE TABLET (20MG  TOTAL) BY MOUTH DAILY, Disp: 90 tablet, Rfl: 1   tiZANidine  (ZANAFLEX )  4 MG tablet, TAKE ONE TABLET (4MG  TOTAL) BY MOUTH DAILY AS NEEDED FOR MUSCLE SPASMS, Disp: 30 tablet, Rfl: 1   amphetamine -dextroamphetamine  (ADDERALL) 10 MG tablet, TAKE ONE TABLET ONCE OR TWICE A DAY AS NEEDED, Disp: 60 tablet, Rfl: 0   cefdinir  (OMNICEF ) 300 MG capsule, Take 1 capsule (300 mg total) by mouth 2 (two) times daily. (Patient not taking: Reported on 01/10/2024), Disp: 14 capsule, Rfl: 0   diphenoxylate -atropine  (LOMOTIL ) 2.5-0.025 MG tablet, Take 1 tablet by mouth 4 (four) times daily as needed for diarrhea or loose stools. (Patient not taking: Reported on 01/10/2024), Disp: 180 tablet, Rfl: 1   fluconazole  (DIFLUCAN ) 200 MG tablet, TAKE ONE TABLET (200MG  TOTAL) BY MOUTH DAILY (Patient not taking: Reported on 01/10/2024), Disp: 14 tablet, Rfl: 0   ketoconazole  (NIZORAL ) 2 % cream, Apply 1 Application topically 2 (two) times daily. (Patient not taking: Reported on 01/10/2024), Disp: 30 g, Rfl: 0  PAST MEDICAL HISTORY: Past Medical History:  Diagnosis Date   ADD (attention deficit disorder)    Adult   Allergic rhinitis    Anxiety    Depression    GERD (gastroesophageal reflux disease)     Hyperlipidemia    Insomnia    Chronic   MS (multiple sclerosis)    Osteopenia    Positive H. pylori test    Reflux     PAST SURGICAL HISTORY: Past Surgical History:  Procedure Laterality Date   BIOPSY  01/09/2020   Procedure: BIOPSY;  Surgeon: Golda Claudis PENNER, MD;  [facility];  Service: Endoscopy;;   BIOPSY  01/24/2023   Procedure: BIOPSY;  Surgeon: Cinderella Deatrice FALCON, MD;  [facility];  Service: Endoscopy;;   BIOPSY  04/18/2023   Procedure: BIOPSY;  Surgeon: Eartha Angelia Sieving, MD;  [facility];  Service: Gastroenterology;;   DOLLENE [facility] STUDY N/A 01/24/2023   Procedure: DOLLENE PH STUDY;  Surgeon: Cinderella Deatrice FALCON, MD;  [facility];  Service: Endoscopy;  Laterality: N/A;  1:45PM;ASA 1-2   BREAST CYST ASPIRATION Left 2004   benign   COLONOSCOPY     COLONOSCOPY N/A 10/12/2016   Procedure: COLONOSCOPY;  Surgeon: Golda Claudis PENNER, MD;  [facility];  Service: Endoscopy;  Laterality: N/A;   COLONOSCOPY N/A 01/09/2020   Procedure: COLONOSCOPY;  Surgeon: Golda Claudis PENNER, MD;  [facility];  Service: Endoscopy;  Laterality: N/A;  100   Cyst removed from left breast     DILATION AND CURETTAGE OF UTERUS     ESOPHAGEAL DILATION N/A 01/09/2020   Procedure: ESOPHAGEAL DILATION;  Surgeon: Golda Claudis PENNER, MD;  [facility];  Service: Endoscopy;  Laterality: N/A;   ESOPHAGOGASTRODUODENOSCOPY     ESOPHAGOGASTRODUODENOSCOPY N/A 10/12/2016   Procedure: ESOPHAGOGASTRODUODENOSCOPY (EGD);  Surgeon: Golda Claudis PENNER, MD;  [facility];  Service: Endoscopy;  Laterality: N/A;  1:45   ESOPHAGOGASTRODUODENOSCOPY N/A 01/09/2020   Procedure: ESOPHAGOGASTRODUODENOSCOPY (EGD);  Surgeon: Golda Claudis PENNER, MD;  [facility];  Service: Endoscopy;  Laterality: N/A;   ESOPHAGOGASTRODUODENOSCOPY (EGD) WITH PROPOFOL  N/A 01/24/2023   Procedure: ESOPHAGOGASTRODUODENOSCOPY (EGD) WITH PROPOFOL ;  Surgeon: Cinderella Deatrice FALCON, MD;  [facility];  Service: Endoscopy;  Laterality: N/A;  1:45PM;ASA 1-2   ESOPHAGOGASTRODUODENOSCOPY (EGD) WITH PROPOFOL  N/A 04/18/2023   Procedure: ESOPHAGOGASTRODUODENOSCOPY (EGD) WITH PROPOFOL ;  Surgeon: Eartha Angelia Sieving, MD;  [facility];  Service: Gastroenterology;  Laterality: N/A;  9:45am;asa 1-2   POLYPECTOMY  10/12/2016  Procedure: POLYPECTOMY;  Surgeon: Golda Claudis PENNER, MD;  [facility];  Service: Endoscopy;;  cecal    POLYPECTOMY  01/09/2020   Procedure: POLYPECTOMY;  Surgeon: Golda Claudis PENNER, MD;  [facility];  Service: Endoscopy;;    FAMILY HISTORY: Family History  Problem Relation Age of Onset   Cancer Mother        Colon   Colon cancer Mother    Hypertension Father     SOCIAL HISTORY: Social History   Socioeconomic History   Marital status: Married    Spouse name: Not on file   Number of children: Not on file   Years of education: Not on file   Highest education level: Bachelor's degree (e.g., BA, AB, BS)  Occupational History   Not on file  Tobacco Use   Smoking status: Never   Smokeless tobacco: Never  Vaping Use   Vaping status: Never Used  Substance and Sexual Activity   Alcohol use: Yes    Comment: Socially   Drug use: No   Sexual activity: Not on file  Other Topics Concern   Not on file  Social History Narrative   Not on file   Social Drivers of Health   Financial Resource Strain: Low Risk  (11/02/2023)   Overall Financial Resource Strain (CARDIA)    Difficulty of Paying Living Expenses: Not very hard  Food Insecurity: No Food Insecurity (11/02/2023)   Hunger Vital Sign    Worried About Running Out of Food in the Last Year: Never true    Ran Out of Food in the Last Year: Never true  Transportation Needs: No Transportation Needs (11/02/2023)   PRAPARE - Administrator, Civil Service (Medical): No    Lack of Transportation (Non-Medical): No  Physical Activity:  Insufficiently Active (11/02/2023)   Exercise Vital Sign    Days of Exercise per Week: 1 day    Minutes of Exercise per Session: 20 min  Stress: Stress Concern Present (11/02/2023)   [employer] of Occupational Health - Occupational Stress Questionnaire    Feeling of Stress: To some extent  Social Connections: Socially Integrated (11/02/2023)   Social Connection and Isolation Panel    Frequency of Communication with Friends and Family: More than three times a week    Frequency of Social Gatherings with Friends and Family: Twice a week    Attends Religious Services: 1 to 4 times per year    Active Member of [employer] or Organizations: Yes    Attends Banker Meetings: 1 to 4 times per year    Marital Status: Married  Catering manager Violence: Not on file       PHYSICAL EXAM  Vitals:   01/10/24 1332  BP: (!) 149/71  Pulse: 97  SpO2: 99%  Weight: 171 lb 8 oz (77.8 kg)  Height: 5' 1 (1.549 m)    Body mass index is 32.4 kg/m.   General: The patient is well-developed and well-nourished and in no acute distress  HEENT:  Head is [place]/AT.  Sclera are anicteric.   Skin: Extremities are without rash or edema.    Neurologic Exam  Mental status: The patient is alert and oriented x 3 at the time of the examination. The patient has apparent normal recent and remote memory, with an apparently normal attention span and concentration ability.   Speech is normal.  Cranial nerves: Extraocular movements are full  There is good facial sensation to soft touch bilaterally.Facial strength is normal.  Trapezius and sternocleidomastoid strength is normal. No dysarthria is noted.   No obvious hearing deficits are noted.  Motor:  Muscle bulk is normal.   Tone is normal. Strength is  5 / 5 in all 4 extremities except 4+/5 left foot/toe extensors.   Sensory: Sensory testing is intact to pinprick, soft touch and vibration sensation in arms and legs.  .  Coordination: Cerebellar  testing reveals good finger-nose-finger and heel-to-shin bilaterally.  Gait and station: Station is normal.   Gait is slightly wide and has mild left foot drop. Her tandem gait is wide. Romberg is negative.   Reflexes: Deep tendon reflexes are symmetric and normal bilaterally.       DIAGNOSTIC DATA (LABS, IMAGING, TESTING) - I reviewed patient records, labs, notes, testing and imaging myself where available.  Lab Results  Component Value Date   WBC 7.3 11/15/2022   HGB 12.6 11/15/2022   HCT 38.4 11/15/2022   MCV 92 11/15/2022   PLT 341 11/15/2022      Component Value Date/Time   NA 140 11/15/2022 1106   K 4.6 11/15/2022 1106   CL 102 11/15/2022 1106   CO2 25 11/15/2022 1106   GLUCOSE 125 (H) 11/15/2022 1106   GLUCOSE 91 03/18/2014 1025   BUN 10 11/15/2022 1106   CREATININE 0.99 11/15/2022 1106   CREATININE 0.78 03/18/2014 1025   CALCIUM  9.7 11/15/2022 1106   PROT 7.0 11/15/2022 1106   ALBUMIN 4.4 11/15/2022 1106   AST 19 11/15/2022 1106   ALT 12 11/15/2022 1106   ALKPHOS 73 11/15/2022 1106   BILITOT 0.3 11/15/2022 1106   GFRNONAA 70 04/14/2020 1122   GFRAA 80 04/14/2020 1122   Lab Results  Component Value Date   CHOL 170 11/15/2022   HDL 62 11/15/2022   LDLCALC 78 11/15/2022   TRIG 178 (H) 11/15/2022   CHOLHDL 2.7 11/15/2022   Lab Results  Component Value Date   HGBA1C 5.7 (H) 11/15/2022   No results found for: VITAMINB12 Lab Results  Component Value Date   TSH 1.920 11/15/2022       ASSESSMENT AND PLAN  Multiple sclerosis  Anxiety and depression  Primary insomnia  Urge incontinence  Delayed sleep phase syndrome  Other fatigue  Overflow diarrhea     She will remain off a DMT.   If any new symptoms, consider MRI to evaluate Resume the Pristiq .    Can take Low dose Adderall once or twice a day prn for MS related fatigue and reduced focus attention Try to go to bed earlier than midnight.    Rtc 12 months or sooner if new or worsening  neurologic issues.       This visit is part of a comprehensive longitudinal care medical relationship regarding the patients primary diagnosis of MS and related concerns.  Shery Wauneka A. Vear, MD, [facility] 01/10/2024, 1:59 PM Certified in Neurology, Clinical Neurophysiology, Sleep Medicine and Neuroimaging  [facility] [address] [place], [place] [address] [phone]

## 2024-01-12 ENCOUNTER — Other Ambulatory Visit: Payer: Self-pay | Admitting: Family Medicine

## 2024-01-15 ENCOUNTER — Telehealth: Payer: Self-pay

## 2024-01-15 NOTE — Telephone Encounter (Signed)
 Patient dropped off document DMV DMV, to be filled out by provider. Patient requested to send it back via Call Patient to pick up within ASAP. Document is located in providers tray at front office.Please advise at Mobile [phone] (mobile)=c

## 2024-01-17 ENCOUNTER — Encounter (INDEPENDENT_AMBULATORY_CARE_PROVIDER_SITE_OTHER): Payer: Self-pay | Admitting: Gastroenterology

## 2024-02-09 ENCOUNTER — Ambulatory Visit

## 2024-02-13 ENCOUNTER — Other Ambulatory Visit: Payer: Self-pay | Admitting: Neurology

## 2024-02-15 NOTE — Telephone Encounter (Signed)
 Last seen on 108/25 Follow up scheduled on 01/09/25  dextroamphetamine -amphetamine  10 mg tablet 01/10/2024 30 60 tablet Sater, Charlie LABOR, MD [facility] - ...    Rx pending to be signed

## 2024-03-18 ENCOUNTER — Other Ambulatory Visit: Payer: Self-pay | Admitting: Family Medicine

## 2024-03-18 DIAGNOSIS — F5101 Primary insomnia: Secondary | ICD-10-CM

## 2024-03-18 DIAGNOSIS — F419 Anxiety disorder, unspecified: Secondary | ICD-10-CM

## 2024-06-06 ENCOUNTER — Ambulatory Visit

## 2025-01-09 ENCOUNTER — Ambulatory Visit: Admitting: Neurology
